# Patient Record
Sex: Male | Born: 1950
Health system: Southern US, Community
[De-identification: ages and names within clinical notes are randomized; demographics above are authoritative.]

## PROBLEM LIST (undated history)

## (undated) DIAGNOSIS — T884XXA Failed or difficult intubation, initial encounter: Secondary | ICD-10-CM

## (undated) DIAGNOSIS — E785 Hyperlipidemia, unspecified: Secondary | ICD-10-CM

## (undated) DIAGNOSIS — E559 Vitamin D deficiency, unspecified: Secondary | ICD-10-CM

## (undated) DIAGNOSIS — N400 Enlarged prostate without lower urinary tract symptoms: Secondary | ICD-10-CM

## (undated) HISTORY — DX: Failed or difficult intubation, initial encounter: T88.4XXA

## (undated) HISTORY — DX: Benign prostatic hyperplasia without lower urinary tract symptoms: N40.0

## (undated) HISTORY — DX: Vitamin D deficiency, unspecified: E55.9

## (undated) HISTORY — PX: CHOLECYSTECTOMY: SHX55

## (undated) HISTORY — DX: Hyperlipidemia, unspecified: E78.5

## (undated) HISTORY — PX: HAND SURGERY: SHX662

---

## 1997-08-02 ENCOUNTER — Ambulatory Visit (HOSPITAL_COMMUNITY): Admission: RE | Admit: 1997-08-02 | Discharge: 1997-08-03 | Payer: Self-pay | Admitting: General Surgery

## 2004-02-05 ENCOUNTER — Ambulatory Visit: Payer: Self-pay

## 2004-02-15 ENCOUNTER — Ambulatory Visit: Payer: Self-pay | Admitting: Cardiology

## 2007-10-22 ENCOUNTER — Ambulatory Visit (HOSPITAL_COMMUNITY): Admission: RE | Admit: 2007-10-22 | Discharge: 2007-10-22 | Payer: Self-pay | Admitting: Orthopaedic Surgery

## 2008-02-29 ENCOUNTER — Encounter (INDEPENDENT_AMBULATORY_CARE_PROVIDER_SITE_OTHER): Payer: Self-pay | Admitting: *Deleted

## 2009-01-24 ENCOUNTER — Telehealth: Payer: Self-pay | Admitting: Internal Medicine

## 2009-08-25 ENCOUNTER — Emergency Department (HOSPITAL_COMMUNITY): Admission: EM | Admit: 2009-08-25 | Discharge: 2009-08-25 | Payer: Self-pay | Admitting: Emergency Medicine

## 2010-02-12 NOTE — Progress Notes (Signed)
Summary: Schedule Colonoscopy  Phone Note Outgoing Call   Call placed by: Hortense Ramal CMA Duncan Dull),  January 24, 2009 1:50 PM Call placed to: Patient Summary of Call: Left message for patient to call back regarding scheduling a recall colonoscopy (due to family history of colon polyps). Initial call taken by: Hortense Ramal CMA [facility] Dull),  January 24, 2009 1:50 PM  Follow-up for Phone Call        Wife states that she will tell patient that he needs to schedule colonoscopy. She states that he works until 5 daily and is unable to speak with [place]. Hortense Ramal CMA Duncan Dull)  January 25, 2009 8:12 AM   Additional Follow-up for Phone Call Additional follow up Details #1::        Patient's wife states that patient got message regarding scheduling a colonoscopy. She states that he does not want to schedule a colonoscopy at this time but that he will call back if he chooses to schedule. Additional Follow-up by: Hortense Ramal CMA Duncan Dull),  January 30, 2009 1:23 PM

## 2011-06-24 ENCOUNTER — Encounter: Payer: Self-pay | Admitting: Internal Medicine

## 2011-08-11 ENCOUNTER — Ambulatory Visit (AMBULATORY_SURGERY_CENTER): Payer: BC Managed Care – PPO | Admitting: *Deleted

## 2011-08-11 VITALS — Ht 74.0 in | Wt 192.0 lb

## 2011-08-11 DIAGNOSIS — Z8371 Family history of colonic polyps: Secondary | ICD-10-CM

## 2011-08-11 DIAGNOSIS — T884XXA Failed or difficult intubation, initial encounter: Secondary | ICD-10-CM | POA: Insufficient documentation

## 2011-08-11 DIAGNOSIS — Z1211 Encounter for screening for malignant neoplasm of colon: Secondary | ICD-10-CM

## 2011-08-11 MED ORDER — MOVIPREP 100 G PO SOLR: ORAL | Status: DC

## 2011-08-12 ENCOUNTER — Encounter: Payer: Self-pay | Admitting: Internal Medicine

## 2011-08-29 ENCOUNTER — Encounter: Payer: Self-pay | Admitting: Internal Medicine

## 2011-08-29 ENCOUNTER — Ambulatory Visit (AMBULATORY_SURGERY_CENTER): Payer: BC Managed Care – PPO | Admitting: Internal Medicine

## 2011-08-29 VITALS — BP 140/75 | HR 79 | Temp 97.2°F | Resp 13 | Ht 74.0 in | Wt 192.0 lb

## 2011-08-29 DIAGNOSIS — Z1211 Encounter for screening for malignant neoplasm of colon: Secondary | ICD-10-CM

## 2011-08-29 MED ORDER — SODIUM CHLORIDE 0.9 % IV SOLN: 500.0000 mL | INTRAVENOUS | Status: DC

## 2011-08-29 NOTE — Patient Instructions (Addendum)
Discharge instructions given with verbal understanding. Normal exam. Resume previous medications. YOU HAD AN ENDOSCOPIC PROCEDURE TODAY AT THE [facility]: Refer to the procedure report that was given to you for any specific questions about what was found during the examination.  If the procedure report does not answer your questions, please call your gastroenterologist to clarify.  If you requested that your care partner not be given the details of your procedure findings, then the procedure report has been included in a sealed envelope for you to review at your convenience later.  YOU SHOULD EXPECT: Some feelings of bloating in the abdomen. Passage of more gas than usual.  Walking can help get rid of the air that was put into your GI tract during the procedure and reduce the bloating. If you had a lower endoscopy (such as a colonoscopy or flexible sigmoidoscopy) you may notice spotting of blood in your stool or on the toilet paper. If you underwent a bowel prep for your procedure, then you may not have a normal bowel movement for a few days.  DIET: Your first meal following the procedure should be a light meal and then it is ok to progress to your normal diet.  A half-sandwich or bowl of soup is an example of a good first meal.  Heavy or fried foods are harder to digest and may make you feel nauseous or bloated.  Likewise meals heavy in dairy and vegetables can cause extra gas to form and this can also increase the bloating.  Drink plenty of fluids but you should avoid alcoholic beverages for 24 hours.  ACTIVITY: Your care partner should take you home directly after the procedure.  You should plan to take it easy, moving slowly for the rest of the day.  You can resume normal activity the day after the procedure however you should NOT DRIVE or use heavy machinery for 24 hours (because of the sedation medicines used during the test).    SYMPTOMS TO REPORT IMMEDIATELY: A gastroenterologist  can be reached at any hour.  During normal business hours, 8:30 AM to 5:00 PM Monday through Friday, call [phone].  After hours and on weekends, please call the GI answering service at [phone] who will take a message and have the physician on call contact you.   Following lower endoscopy (colonoscopy or flexible sigmoidoscopy):  Excessive amounts of blood in the stool  Significant tenderness or worsening of abdominal pains  Swelling of the abdomen that is new, acute  Fever of 100F or higher  FOLLOW UP: If any biopsies were taken you will be contacted by phone or by letter within the next 1-3 weeks.  Call your gastroenterologist if you have not heard about the biopsies in 3 weeks.  Our staff will call the home number listed on your records the next business day following your procedure to check on you and address any questions or concerns that you may have at that time regarding the information given to you following your procedure. This is a courtesy call and so if there is no answer at the home number and we have not heard from you through the emergency physician on call, we will assume that you have returned to your regular daily activities without incident.  SIGNATURES/CONFIDENTIALITY: You and/or your care partner have signed paperwork which will be entered into your electronic medical record.  These signatures attest to the fact that that the information above on your After Visit Summary has been reviewed   and is understood.  Full responsibility of the confidentiality of this discharge information lies with you and/or your care-partner. 

## 2011-08-29 NOTE — Op Note (Signed)
[facility] [address] [place], [place]  [address]  COLONOSCOPY PROCEDURE REPORT  PATIENT:  Manuel, Kidd  MR#:  [id number] BIRTHDATE:  August 19, 1950, 61 yrs. old  GENDER:  male ENDOSCOPIST:  Hedwig Morton. Juanda Chance, MD REF. BY:  Rudi Heap, M.D. PROCEDURE DATE:  08/29/2011 PROCEDURE:  Colonoscopy 95621 ASA CLASS:  Class II INDICATIONS:  family Hx of polyps, colorectal cancer screening, average risk prior colon 3086,5784 MEDICATIONS:   MAC sedation, administered by CRNA, propofol (Diprivan) 130 mg  DESCRIPTION OF PROCEDURE:   After the risks and benefits and of the procedure were explained, informed consent was obtained. Digital rectal exam was performed and revealed no rectal masses. The LB CF-[id number] endoscope was introduced through the anus and advanced to the cecum, which was identified by both the appendix and ileocecal valve.  The quality of the prep was good, using MoviPrep.  The instrument was then slowly withdrawn as the colon was fully examined. <<PROCEDUREIMAGES>>  FINDINGS:  No polyps or cancers were seen (see image1, image2, image3, image4, image5, and image6).   Retroflexed views in the rectum revealed no abnormalities.    The scope was then withdrawn from the patient and the procedure completed.  COMPLICATIONS:  None ENDOSCOPIC IMPRESSION: 1) No polyps or cancers 2) Normal colonoscopy RECOMMENDATIONS: 1) High fiber diet.  REPEAT EXAM:  In 10 year(s) for.  ______________________________ Hedwig Morton. Juanda Chance, MD  CC:  n. eSIGNED:   Hedwig Morton. Keltie Labell at 08/29/2011 09:21 AM  Clare Charon, [phone]

## 2011-09-01 ENCOUNTER — Telehealth: Payer: Self-pay | Admitting: *Deleted

## 2011-09-01 NOTE — Telephone Encounter (Signed)
  Follow up Call-  Call back number 08/29/2011  Post procedure Call Back phone  # [phone]  Permission to leave phone message Yes     Patient questions:  Do you have a fever, pain , or abdominal swelling? no Pain Score  0 *  Have you tolerated food without any problems? yes  Have you been able to return to your normal activities? yes  Do you have any questions about your discharge instructions: Diet   no Medications  no Follow up visit  no  Do you have questions or concerns about your Care? no  Actions: * If pain score is 4 or above: No action needed, pain <4.

## 2012-03-02 ENCOUNTER — Encounter: Payer: Self-pay | Admitting: Family Medicine

## 2012-03-02 DIAGNOSIS — E559 Vitamin D deficiency, unspecified: Secondary | ICD-10-CM | POA: Insufficient documentation

## 2012-03-02 DIAGNOSIS — E785 Hyperlipidemia, unspecified: Secondary | ICD-10-CM | POA: Insufficient documentation

## 2012-04-07 ENCOUNTER — Encounter: Payer: Self-pay | Admitting: Family Medicine

## 2012-04-07 ENCOUNTER — Ambulatory Visit (INDEPENDENT_AMBULATORY_CARE_PROVIDER_SITE_OTHER): Payer: BC Managed Care – PPO | Admitting: Family Medicine

## 2012-04-07 VITALS — BP 119/78 | HR 70 | Temp 97.1°F | Ht 74.0 in | Wt 194.0 lb

## 2012-04-07 DIAGNOSIS — E559 Vitamin D deficiency, unspecified: Secondary | ICD-10-CM

## 2012-04-07 DIAGNOSIS — R718 Other abnormality of red blood cells: Secondary | ICD-10-CM

## 2012-04-07 DIAGNOSIS — Z139 Encounter for screening, unspecified: Secondary | ICD-10-CM

## 2012-04-07 DIAGNOSIS — E785 Hyperlipidemia, unspecified: Secondary | ICD-10-CM

## 2012-04-07 DIAGNOSIS — D582 Other hemoglobinopathies: Secondary | ICD-10-CM

## 2012-04-07 LAB — BASIC METABOLIC PANEL WITH GFR
BUN: 12 mg/dL (ref 6–23)
CO2: 28 mEq/L (ref 19–32)
Calcium: 9.4 mg/dL (ref 8.4–10.5)
Chloride: 105 mEq/L (ref 96–112)
Creat: 0.88 mg/dL (ref 0.50–1.35)
GFR, Est Non African American: >89 mL/min
Potassium: 4.2 mEq/L (ref 3.5–5.3)
Sodium: 140 mEq/L (ref 135–145)

## 2012-04-07 LAB — POCT CBC
Granulocyte percent: 64.2 %G (ref 37–80)
HCT, POC: 46.8 % (ref 43.5–53.7)
Hemoglobin: 15.4 g/dL (ref 14.1–18.1)
Lymph, poc: 1.3 (ref 0.6–3.4)
MCH, POC: 27.6 pg (ref 27–31.2)
MCHC: 33 g/dL (ref 31.8–35.4)
MCV: 83.4 fL (ref 80–97)
POC Granulocyte: 3 (ref 2–6.9)
POC LYMPH PERCENT: 28.5 %L (ref 10–50)
Platelet Count, POC: 141 10*3/uL — AB (ref 142–424)
RBC: 5.6 M/uL (ref 4.69–6.13)
RDW, POC: 12.8 %

## 2012-04-07 LAB — HEPATIC FUNCTION PANEL
Albumin: 4.2 g/dL (ref 3.5–5.2)
Alkaline Phosphatase: 50 U/L (ref 39–117)
Bilirubin, Direct: 0.2 mg/dL (ref 0.0–0.3)
Total Bilirubin: 0.9 mg/dL (ref 0.3–1.2)
Total Protein: 6.8 g/dL (ref 6.0–8.3)

## 2012-04-07 NOTE — Patient Instructions (Addendum)
FOBT pt declined Continue current meds and therapeutic lifestyle changes

## 2012-04-07 NOTE — Progress Notes (Signed)
  Subjective:    Patient ID: Manuel Kidd, male    DOB: April 03, 1950, 62 y.o.   MRN: [id number]  HPI This patient presents for recheck of multiple medical problems.   Patient Active Problem List  Diagnosis  . Difficult airway for intubation  . Vitamin D deficiency  . Other and unspecified hyperlipidemia      The allergies, current medications, past medical history, surgical history, family and social history are reviewed.  Immunizations reviewed.  Health maintenance reviewed.        Review of Systems  Constitutional: Negative.   HENT: Negative.   Eyes: Negative.   Respiratory: Negative.   Cardiovascular: Negative.   Gastrointestinal: Negative.   Genitourinary: Negative.   Musculoskeletal: Negative.   Neurological: Negative.   Psychiatric/Behavioral: Negative.        Objective:   Physical Exam BP 119/78  Pulse 70  Temp(Src) 97.1 F (36.2 C) (Oral)  Ht 6\' 2"  (1.88 m)  Wt 194 lb (87.998 kg)  BMI 24.9 kg/m2  The patient appeared well nourished and normally developed, alert and oriented to time and place. Speech, behavior and judgement appear normal. Vital signs as documented.  Head exam is unremarkable. No scleral icterus or pallor noted.  Neck is without jugular venous distension, thyromegally, or carotid bruits. Carotid upstrokes are brisk bilaterally. No cervical adenopathy. Lungs are clear anteriorly and posteriorly to auscultation. Normal respiratory effort. Cardiac exam reveals regular rate and rhythm.Rate is 72/min. First and second heart sounds normal. No murmurs, rubs or gallops.  Abdominal exam reveals normal bowl sounds, no masses, no organomegaly and no aortic enlargement. No inguinal adenopathy. Extremities are nonedematous and both femoral and pedal pulses are normal. Skin without pallor or jaundice.Bruise to right chest wall.  Warm and dry, without rash. Neurologic exam reveals normal deep tendon reflexes and normal sensation.           Assessment & Plan:   1. Other and unspecified hyperlipidemia  - BASIC METABOLIC PANEL WITH GFR - Hepatic function panel - NMR Lipoprofile with Lipids  2. Screening  - POCT CBC - Vitamin D 25 hydroxy  3. Vitamin D deficiency  - Vitamin D 25 hydroxy  4. Elevated hematocrit  - POCT CBC

## 2012-04-08 LAB — VITAMIN D 25 HYDROXY (VIT D DEFICIENCY, FRACTURES): Vit D, 25-Hydroxy: 43 ng/mL (ref 30–89)

## 2012-04-09 LAB — NMR LIPOPROFILE WITH LIPIDS
HDL Particle Number: 28 umol/L — ABNORMAL LOW (ref >=30.5)
LDL Particle Number: 1031 nmol/L — ABNORMAL HIGH (ref <1000)
Large HDL-P: 3.2 umol/L — ABNORMAL LOW (ref >=4.8)

## 2012-08-16 ENCOUNTER — Ambulatory Visit (INDEPENDENT_AMBULATORY_CARE_PROVIDER_SITE_OTHER): Payer: BC Managed Care – PPO | Admitting: Family Medicine

## 2012-08-16 ENCOUNTER — Other Ambulatory Visit: Payer: BC Managed Care – PPO

## 2012-08-16 ENCOUNTER — Encounter: Payer: Self-pay | Admitting: Family Medicine

## 2012-08-16 VITALS — BP 119/73 | HR 61 | Temp 97.2°F | Ht 74.0 in | Wt 191.6 lb

## 2012-08-16 DIAGNOSIS — R5383 Other fatigue: Secondary | ICD-10-CM

## 2012-08-16 DIAGNOSIS — E785 Hyperlipidemia, unspecified: Secondary | ICD-10-CM

## 2012-08-16 DIAGNOSIS — E559 Vitamin D deficiency, unspecified: Secondary | ICD-10-CM

## 2012-08-16 DIAGNOSIS — N400 Enlarged prostate without lower urinary tract symptoms: Secondary | ICD-10-CM | POA: Insufficient documentation

## 2012-08-16 DIAGNOSIS — R5381 Other malaise: Secondary | ICD-10-CM

## 2012-08-16 LAB — POCT CBC
Granulocyte percent: 70.2 %G (ref 37–80)
HCT, POC: 43.2 % — AB (ref 43.5–53.7)
Hemoglobin: 14.9 g/dL (ref 14.1–18.1)
Lymph, poc: 1.2 (ref 0.6–3.4)
MCH, POC: 28.4 pg (ref 27–31.2)
MCHC: 34.5 g/dL (ref 31.8–35.4)
MCV: 82.3 fL (ref 80–97)
MPV: 7.4 fL (ref 0–99.8)
POC Granulocyte: 3.3 (ref 2–6.9)
POC LYMPH PERCENT: 25.6 %L (ref 10–50)
Platelet Count, POC: 124 10*3/uL — AB (ref 142–424)
RBC: 5.3 M/uL (ref 4.69–6.13)
RDW, POC: 12.8 %
WBC: 4.7 10*3/uL (ref 4.6–10.2)

## 2012-08-16 NOTE — Patient Instructions (Addendum)
Fall precautions discussed Continue current meds and therapeutic lifestyle changes Return FOBT as directed

## 2012-08-16 NOTE — Progress Notes (Signed)
  Subjective:    Patient ID: Manuel Kidd, male    DOB: [birth year], 62 y.o.   MRN: [id number]  HPI Patient comes in today for followup of chronic medical problems. These include hyperlipidemia BPH and vitamin D deficiency. He comes in today with his wife. As far as health maintenance parameters he is to use a PSA, rectal exam and an FOBT.  Review of Systems  Constitutional: Positive for fatigue (slight).  HENT: Negative for ear pain, congestion, sore throat, postnasal drip and sinus pressure.   Respiratory: Negative.  Negative for cough, choking, shortness of breath and wheezing.   Cardiovascular: Negative for chest pain, palpitations and leg swelling.  Gastrointestinal: Negative.  Negative for nausea, vomiting, abdominal pain, diarrhea and constipation.  Endocrine: Negative.  Negative for cold intolerance and polydipsia.  Genitourinary: Negative for dysuria, urgency, frequency, hematuria, scrotal swelling and penile pain.  Musculoskeletal: Negative.  Negative for back pain, joint swelling and arthralgias.  Skin: Negative.  Negative for color change, rash and wound.  Allergic/Immunologic: Negative for environmental allergies and food allergies.  Neurological: Negative for dizziness, tremors, syncope, speech difficulty, weakness, light-headedness and headaches.  Hematological: Negative.   Psychiatric/Behavioral: Negative.  Negative for sleep disturbance. The patient is not nervous/anxious.        Objective:   Physical Exam BP 119/73  Pulse 61  Temp(Src) 97.2 F (36.2 C) (Oral)  Ht 6\' 2"  (1.88 m)  Wt 191 lb 9.6 oz (86.909 kg)  BMI 24.59 kg/m2  The patient appeared well nourished and normally developed, alert and oriented to time and place. Speech, behavior and judgement appear normal. Vital signs as documented.  Head exam is unremarkable. No scleral icterus or pallor noted. Ears nose and throat were within normal limits.  Neck is without jugular venous distension, thyromegally, or  carotid bruits. Carotid upstrokes are brisk bilaterally. No cervical adenopathy. Lungs are clear anteriorly and posteriorly to auscultation. Normal respiratory effort. Cardiac exam reveals regular rate and rhythm at 60 per minute. First and second heart sounds normal.  No murmurs, rubs or gallops.  Abdominal exam reveals normal bowl sounds, no masses, no organomegaly and no aortic enlargement. No inguinal adenopathy.  And prostate exam was done today. There were no rectal masses. Prostate was smooth enlarged without lumps. There was no hernia palpated. Extremities are nonedematous and both femoral and pedal pulses are normal. Skin without pallor or jaundice.  Warm and dry, without rash. He did have some bruises on his abdomen and his left thigh, and he attributed these to his work. Neurologic exam reveals normal deep tendon reflexes and normal sensation.          Assessment & Plan:  1. Hyperlipidemia - Hepatic function panel; Standing - NMR, lipoprofile; Standing - Hepatic function panel - NMR, lipoprofile  2. Vitamin D deficiency - Vitamin D 25 hydroxy; Standing - Vitamin D 25 hydroxy  3. BPH (benign prostatic hypertrophy) - PSA, total and free  4. Fatigue - POCT CBC; Standing - BMP8+EGFR; Standing - Thyroid Panel With TSH - POCT CBC - BMP8+EGFR  Patient Instructions  Fall precautions discussed Continue current meds and therapeutic lifestyle changes Return FOBT as directed   Nyra Capes MD

## 2012-08-18 LAB — HEPATIC FUNCTION PANEL
Albumin: 4.5 g/dL (ref 3.6–4.8)
Total Bilirubin: 0.6 mg/dL (ref 0.0–1.2)
Total Protein: 6.5 g/dL (ref 6.0–8.5)

## 2012-08-18 LAB — NMR, LIPOPROFILE
Cholesterol: 132 mg/dL (ref <200)
HDL Cholesterol by NMR: 40 mg/dL (ref >=40)
LDL Particle Number: 1059 nmol/L — ABNORMAL HIGH (ref <1000)
LDL Size: 20.7 nm (ref >20.5)
LDLC SERPL CALC-MCNC: 79 mg/dL (ref <100)
LP-IR Score: 43 (ref <=45)
Triglycerides by NMR: 63 mg/dL (ref <150)

## 2012-08-18 LAB — THYROID PANEL WITH TSH
Free Thyroxine Index: 2.3 (ref 1.2–4.9)
T3 Uptake Ratio: 27 % (ref 24–39)
T4, Total: 8.7 ug/dL (ref 4.5–12.0)

## 2012-08-18 LAB — BMP8+EGFR
BUN/Creatinine Ratio: 17 (ref 10–22)
BUN: 16 mg/dL (ref 8–27)
CO2: 25 mmol/L (ref 18–29)
GFR calc Af Amer: 99 mL/min/{1.73_m2} (ref >59)

## 2012-08-18 LAB — PSA, TOTAL AND FREE: PSA, Free Pct: 24.4 %

## 2012-08-30 ENCOUNTER — Telehealth: Payer: Self-pay | Admitting: *Deleted

## 2012-08-30 NOTE — Telephone Encounter (Signed)
Message copied by Bearl Mulberry on Mon Aug 30, 2012  9:38 AM ------      Message from: Ernestina Penna      Created: Wed Aug 18, 2012  7:56 AM       The PSA is within normal limits at 2.7-----the previous PSA was 2.67 and therefore this number is stable.      The blood sugar kidney function tests and electrolytes were good.      All liver function tests were within normal limits.      Thyroid tests were within normal limits.      Vitamin D was good and within normal limits.      : Advanced lipid testing the total LDL particle number was about the same as it was before at 1059. Previously it was 1031. This number should be less than 1000. The HDL particle number remains low. The triglycerides are good. Continue current meds but try to do better with diet and exercise ------

## 2012-08-30 NOTE — Telephone Encounter (Signed)
Pt's wife notified of results. 

## 2012-11-18 ENCOUNTER — Other Ambulatory Visit: Payer: Self-pay

## 2012-12-20 ENCOUNTER — Encounter: Payer: Self-pay | Admitting: *Deleted

## 2012-12-20 ENCOUNTER — Ambulatory Visit (INDEPENDENT_AMBULATORY_CARE_PROVIDER_SITE_OTHER): Payer: BC Managed Care – PPO | Admitting: Family Medicine

## 2012-12-20 ENCOUNTER — Encounter: Payer: Self-pay | Admitting: Family Medicine

## 2012-12-20 VITALS — BP 123/76 | HR 67 | Temp 97.6°F | Ht 74.0 in | Wt 195.0 lb

## 2012-12-20 DIAGNOSIS — E559 Vitamin D deficiency, unspecified: Secondary | ICD-10-CM

## 2012-12-20 DIAGNOSIS — N400 Enlarged prostate without lower urinary tract symptoms: Secondary | ICD-10-CM

## 2012-12-20 DIAGNOSIS — L570 Actinic keratosis: Secondary | ICD-10-CM

## 2012-12-20 DIAGNOSIS — E785 Hyperlipidemia, unspecified: Secondary | ICD-10-CM

## 2012-12-20 LAB — POCT CBC
Granulocyte percent: 66 %G (ref 37–80)
HCT, POC: 49 % (ref 43.5–53.7)
Hemoglobin: 15.7 g/dL (ref 14.1–18.1)
MCHC: 32.1 g/dL (ref 31.8–35.4)
MPV: 7 fL (ref 0–99.8)
RBC: 5.7 M/uL (ref 4.69–6.13)
RDW, POC: 12.9 %

## 2012-12-20 NOTE — Progress Notes (Signed)
Subjective:    Patient ID: Manuel Kidd, male    DOB: April 10, 1950, 62 y.o.   MRN: [id number]  HPI Pt here for follow up and management of chronic medical problems. Patient has no particular complaints as noted by the review of systems. He does have an irritated area on the left ear pinna. He says this has been here for 2-3 years. He'll get lab work done today. He is given and FOBT today to return. And he is going to check with his insurance regarding the Prevnar vaccine.      Patient Active Problem List   Diagnosis Date Noted  . Hyperlipidemia 08/16/2012  . BPH (benign prostatic hypertrophy) 08/16/2012  . Vitamin D deficiency   . Difficult airway for intubation    Outpatient Encounter Prescriptions as of 12/20/2012  Medication Sig  . aspirin 81 MG tablet Take 81 mg by mouth daily.  . Multiple Vitamin (MULTIVITAMIN) tablet Take 1 tablet by mouth daily.  . Omega-3 Fatty Acids (FISH OIL) 1000 MG CAPS Take 1 capsule by mouth daily. 5 days a week  . simvastatin (ZOCOR) 10 MG tablet Take 1 tablet by mouth Daily.  Marland Kitchen VITAMIN D, ERGOCALCIFEROL, PO Take 2,000 Units by mouth daily. 5 days a week    Review of Systems  Constitutional: Negative.   HENT: Negative.   Eyes: Negative.   Respiratory: Negative.   Cardiovascular: Negative.   Gastrointestinal: Negative.   Endocrine: Negative.   Genitourinary: Negative.   Musculoskeletal: Negative.   Skin: Negative.        Check place on left ear  Allergic/Immunologic: Negative.   Neurological: Negative.   Hematological: Negative.   Psychiatric/Behavioral: Negative.        Objective:   Physical Exam  Nursing note and vitals reviewed. Constitutional: He is oriented to person, place, and time. He appears well-developed and well-nourished. No distress.  Pleasant and cooperative  HENT:  Head: Normocephalic and atraumatic.  Right Ear: External ear normal.  Left Ear: External ear normal.  Nose: Nose normal.  Mouth/Throat: Oropharynx is  clear and moist. No oropharyngeal exudate.  Eyes: Conjunctivae and EOM are normal. Pupils are equal, round, and reactive to light. Right eye exhibits no discharge. Left eye exhibits no discharge. No scleral icterus.  Neck: Normal range of motion. Neck supple. No thyromegaly present.  No carotid bruits  Cardiovascular: Normal rate, regular rhythm and normal heart sounds.  Exam reveals no gallop and no friction rub.   No murmur heard. 60 per minute  Pulmonary/Chest: Effort normal and breath sounds normal. No respiratory distress. He has no wheezes. He has no rales. He exhibits no tenderness.  Abdominal: Soft. Bowel sounds are normal. He exhibits no mass. There is no tenderness. There is no rebound and no guarding.  Musculoskeletal: Normal range of motion. He exhibits no edema and no tenderness.  Lymphadenopathy:    He has no cervical adenopathy.  Neurological: He is alert and oriented to person, place, and time. He has normal reflexes. No cranial nerve deficit.  Skin: Skin is warm and dry. No rash noted. No erythema. No pallor.  Small dry skin patch on left ear pinna  Psychiatric: He has a normal mood and affect. His behavior is normal. Judgment and thought content normal.   BP 123/76  Pulse 67  Temp(Src) 97.6 F (36.4 C) (Oral)  Ht 6\' 2"  (1.88 m)  Wt 195 lb (88.451 kg)  BMI 25.03 kg/m2  Cryotherapy was performed on the left ear for an actinic keratosis.  The patient tolerated this procedure well.      Assessment & Plan:  1. Vitamin D deficiency - POCT CBC - Vit D  25 hydroxy (rtn osteoporosis monitoring)  2. Hyperlipidemia - POCT CBC - Hepatic function panel - BMP8+EGFR - NMR, lipoprofile  3. BPH (benign prostatic hypertrophy) - POCT CBC  4. Actinic keratosis   Patient Instructions  Continue current medications. Continue good therapeutic lifestyle changes which include good diet and exercise. Fall precautions discussed with patient. Schedule your flu vaccine if you  haven't had it yet If you are over 23 years old - you may need Prevnar 13 or the adult Pneumonia vaccine. The area on the left ear where we did cryotherapy today may form a small blister. Keep the area clean with alcohol and if any future problems return to clinic Use a cool mist humidifier this winter at home and keep the house cooler   Nyra Capes MD

## 2012-12-20 NOTE — Patient Instructions (Addendum)
Continue current medications. Continue good therapeutic lifestyle changes which include good diet and exercise. Fall precautions discussed with patient. Schedule your flu vaccine if you haven't had it yet If you are over 62 years old - you may need Prevnar 13 or the adult Pneumonia vaccine. The area on the left ear where we did cryotherapy today may form a small blister. Keep the area clean with alcohol and if any future problems return to clinic Use a cool mist humidifier this winter at home and keep the house cooler

## 2012-12-20 NOTE — Addendum Note (Signed)
Addended by: Orma Render F on: 12/20/2012 03:41 PM   Modules accepted: Orders

## 2012-12-20 NOTE — Addendum Note (Signed)
Addended by: Orma Render F on: 12/20/2012 03:46 PM   Modules accepted: Orders

## 2012-12-20 NOTE — Progress Notes (Signed)
Quick Note:  Copy of labs sent to patient ______ 

## 2012-12-21 ENCOUNTER — Ambulatory Visit: Payer: BC Managed Care – PPO | Admitting: Family Medicine

## 2012-12-22 LAB — HEPATIC FUNCTION PANEL
ALT: 17 IU/L (ref 0–44)
AST: 24 IU/L (ref 0–40)
Bilirubin, Direct: 0.19 mg/dL (ref 0.00–0.40)
Total Bilirubin: 0.7 mg/dL (ref 0.0–1.2)

## 2012-12-22 LAB — BMP8+EGFR
BUN: 12 mg/dL (ref 8–27)
Creatinine, Ser: 0.94 mg/dL (ref 0.76–1.27)
Glucose: 98 mg/dL (ref 65–99)
Sodium: 143 mmol/L (ref 134–144)

## 2012-12-22 LAB — NMR, LIPOPROFILE
HDL Particle Number: 27 umol/L — ABNORMAL LOW (ref >=30.5)
LDL Size: 20.4 nm — ABNORMAL LOW (ref >20.5)
Small LDL Particle Number: 854 nmol/L — ABNORMAL HIGH (ref <=527)

## 2012-12-22 LAB — FECAL OCCULT BLOOD, IMMUNOCHEMICAL: Fecal Occult Bld: NEGATIVE

## 2013-04-20 ENCOUNTER — Ambulatory Visit (INDEPENDENT_AMBULATORY_CARE_PROVIDER_SITE_OTHER): Payer: BC Managed Care – PPO

## 2013-04-20 ENCOUNTER — Ambulatory Visit (INDEPENDENT_AMBULATORY_CARE_PROVIDER_SITE_OTHER): Payer: BC Managed Care – PPO | Admitting: Family Medicine

## 2013-04-20 ENCOUNTER — Encounter: Payer: Self-pay | Admitting: Family Medicine

## 2013-04-20 VITALS — BP 124/75 | HR 61 | Temp 97.4°F | Ht 74.0 in | Wt 195.0 lb

## 2013-04-20 DIAGNOSIS — E559 Vitamin D deficiency, unspecified: Secondary | ICD-10-CM

## 2013-04-20 DIAGNOSIS — E785 Hyperlipidemia, unspecified: Secondary | ICD-10-CM

## 2013-04-20 DIAGNOSIS — N400 Enlarged prostate without lower urinary tract symptoms: Secondary | ICD-10-CM

## 2013-04-20 LAB — POCT CBC
GRANULOCYTE PERCENT: 75 % (ref 37–80)
HCT, POC: 47.3 % (ref 43.5–53.7)
Hemoglobin: 15.5 g/dL (ref 14.1–18.1)
Lymph, poc: 1.1 (ref 0.6–3.4)
MCH: 27.7 pg (ref 27–31.2)
MCHC: 32.7 g/dL (ref 31.8–35.4)
MCV: 84.6 fL (ref 80–97)
MPV: 7.7 fL (ref 0–99.8)
POC Granulocyte: 3.9 (ref 2–6.9)
POC LYMPH %: 21.2 % (ref 10–50)
Platelet Count, POC: 127 10*3/uL — AB (ref 142–424)
RBC: 5.6 M/uL (ref 4.69–6.13)
RDW, POC: 12.8 %
WBC: 5.2 10*3/uL (ref 4.6–10.2)

## 2013-04-20 NOTE — Patient Instructions (Addendum)
Continue current medications. Continue good therapeutic lifestyle changes which include good diet and exercise. Fall precautions discussed with patient. If an FOBT was given today- please return it to our front desk. If you are over 8910 years old - you may need Prevnar 13 or the adult Pneumonia vaccine.  Will call him with the results of the lab work once these results are available as well as an official reading on the chest x-ray.

## 2013-04-20 NOTE — Progress Notes (Signed)
Subjective:    Patient ID: Manuel Kidd, male    DOB: 21-Feb-1950, 63 y.o.   MRN: [id number]  HPI Pt here for follow up and management of chronic medical problems. The patient is doing well today with no particular problems. He is due to receive a chest x-ray and get his lab checked today. He was informed that he needed to check with his insurance regarding the Prevnar vaccine.         Patient Active Problem List   Diagnosis Date Noted  . Hyperlipidemia 08/16/2012  . BPH (benign prostatic hypertrophy) 08/16/2012  . Vitamin D deficiency   . Difficult airway for intubation    Outpatient Encounter Prescriptions as of 04/20/2013  Medication Sig  . aspirin 81 MG tablet Take 81 mg by mouth daily.  . Multiple Vitamin (MULTIVITAMIN) tablet Take 1 tablet by mouth daily.  . Omega-3 Fatty Acids (FISH OIL) 1000 MG CAPS Take 1 capsule by mouth daily. 5 days a week  . simvastatin (ZOCOR) 10 MG tablet Take 1 tablet by mouth Daily.  Marland Kitchen VITAMIN D, ERGOCALCIFEROL, PO Take 2,000 Units by mouth daily. 5 days a week    Review of Systems  Constitutional: Negative.   HENT: Negative.   Eyes: Negative.   Respiratory: Negative.   Cardiovascular: Negative.   Gastrointestinal: Negative.   Endocrine: Negative.   Genitourinary: Negative.   Musculoskeletal: Negative.   Skin: Negative.   Allergic/Immunologic: Negative.   Neurological: Negative.   Hematological: Negative.   Psychiatric/Behavioral: Negative.        Objective:   Physical Exam  Nursing note and vitals reviewed. Constitutional: He is oriented to person, place, and time. He appears well-developed and well-nourished.  HENT:  Head: Normocephalic and atraumatic.  Right Ear: External ear normal.  Left Ear: External ear normal.  Nose: Nose normal.  Mouth/Throat: Oropharynx is clear and moist. No oropharyngeal exudate.  Eyes: Conjunctivae and EOM are normal. Pupils are equal, round, and reactive to light. Right eye exhibits no discharge.  Left eye exhibits no discharge. No scleral icterus.  Neck: Normal range of motion. Neck supple. No thyromegaly present.  No carotid bruits  Cardiovascular: Normal rate, regular rhythm, normal heart sounds and intact distal pulses.  Exam reveals no gallop and no friction rub.   No murmur heard. At 72 per minute  Pulmonary/Chest: Effort normal and breath sounds normal. No respiratory distress. He has no wheezes. He has no rales. He exhibits no tenderness.  Abdominal: Soft. Bowel sounds are normal. He exhibits no mass. There is no tenderness. There is no rebound and no guarding.  Musculoskeletal: Normal range of motion. He exhibits no edema and no tenderness.  Lymphadenopathy:    He has no cervical adenopathy.  Neurological: He is alert and oriented to person, place, and time. He has normal reflexes. No cranial nerve deficit.  Skin: Skin is warm and dry. No rash noted. No erythema. No pallor.  Psychiatric: He has a normal mood and affect. His behavior is normal. Judgment and thought content normal.   BP 124/75  Pulse 61  Temp(Src) 97.4 F (36.3 C) (Oral)  Ht $R'6\' 2"'JD$  (1.88 m)  Wt 195 lb (88.451 kg)  BMI 25.03 kg/m2  [facility] reading (PRIMARY) by  Dr. Brunilda Payor x-ray-no active disease                                       Assessment &  Plan:  1. BPH (benign prostatic hypertrophy) - POCT CBC  2. Hyperlipidemia - POCT CBC - BMP8+EGFR - Hepatic function panel - NMR, lipoprofile - DG Chest 2 View; Future  3. Vitamin D deficiency - POCT CBC - Vit D  25 hydroxy (rtn osteoporosis monitoring)  Patient Instructions  Continue current medications. Continue good therapeutic lifestyle changes which include good diet and exercise. Fall precautions discussed with patient. If an FOBT was given today- please return it to our front desk. If you are over 21 years old - you may need Prevnar 59 or the adult Pneumonia vaccine.  Will call him with the results of the lab work once these results are  available as well as an official reading on the chest x-ray.   Arrie Senate MD

## 2013-04-21 LAB — HEPATIC FUNCTION PANEL
ALBUMIN: 4.5 g/dL (ref 3.6–4.8)
ALT: 20 IU/L (ref 0–44)
AST: 28 IU/L (ref 0–40)
Alkaline Phosphatase: 58 IU/L (ref 39–117)
BILIRUBIN DIRECT: 0.17 mg/dL (ref 0.00–0.40)
BILIRUBIN TOTAL: 0.7 mg/dL (ref 0.0–1.2)
TOTAL PROTEIN: 6.9 g/dL (ref 6.0–8.5)

## 2013-04-21 LAB — BMP8+EGFR
BUN/Creatinine Ratio: 14 (ref 10–22)
BUN: 14 mg/dL (ref 8–27)
CALCIUM: 9.3 mg/dL (ref 8.6–10.2)
CO2: 27 mmol/L (ref 18–29)
CREATININE: 1 mg/dL (ref 0.76–1.27)
Chloride: 101 mmol/L (ref 97–108)
GFR calc Af Amer: 93 mL/min/{1.73_m2} (ref >59)
GFR, EST NON AFRICAN AMERICAN: 80 mL/min/{1.73_m2} (ref >59)
Glucose: 96 mg/dL (ref 65–99)
Potassium: 4.4 mmol/L (ref 3.5–5.2)
SODIUM: 144 mmol/L (ref 134–144)

## 2013-04-21 LAB — NMR, LIPOPROFILE
Cholesterol: 183 mg/dL (ref <200)
HDL CHOLESTEROL BY NMR: 45 mg/dL (ref >=40)
HDL Particle Number: 30.9 umol/L (ref >=30.5)
LDL Particle Number: 1923 nmol/L — ABNORMAL HIGH (ref <1000)
LDL Size: 20.3 nm — ABNORMAL LOW (ref >20.5)
LDLC SERPL CALC-MCNC: 124 mg/dL — ABNORMAL HIGH (ref <100)
LP-IR SCORE: 51 — AB (ref <=45)
Small LDL Particle Number: 1079 nmol/L — ABNORMAL HIGH (ref <=527)
Triglycerides by NMR: 70 mg/dL (ref <150)

## 2013-04-21 LAB — VITAMIN D 25 HYDROXY (VIT D DEFICIENCY, FRACTURES): VIT D 25 HYDROXY: 62.2 ng/mL (ref 30.0–100.0)

## 2013-08-25 ENCOUNTER — Ambulatory Visit (INDEPENDENT_AMBULATORY_CARE_PROVIDER_SITE_OTHER): Payer: BC Managed Care – PPO | Admitting: Family Medicine

## 2013-08-25 ENCOUNTER — Encounter: Payer: Self-pay | Admitting: Family Medicine

## 2013-08-25 VITALS — BP 125/81 | HR 66 | Temp 97.6°F | Ht 74.0 in | Wt 195.0 lb

## 2013-08-25 DIAGNOSIS — E785 Hyperlipidemia, unspecified: Secondary | ICD-10-CM

## 2013-08-25 DIAGNOSIS — Z0000 Encounter for general adult medical examination without abnormal findings: Secondary | ICD-10-CM

## 2013-08-25 DIAGNOSIS — N400 Enlarged prostate without lower urinary tract symptoms: Secondary | ICD-10-CM

## 2013-08-25 DIAGNOSIS — E559 Vitamin D deficiency, unspecified: Secondary | ICD-10-CM

## 2013-08-25 LAB — POCT UA - MICROSCOPIC ONLY
Casts, Ur, LPF, POC: NEGATIVE
Crystals, Ur, HPF, POC: NEGATIVE
Mucus, UA: NEGATIVE
RBC, URINE, MICROSCOPIC: NEGATIVE
WBC, UR, HPF, POC: NEGATIVE
Yeast, UA: NEGATIVE

## 2013-08-25 LAB — POCT URINALYSIS DIPSTICK
BILIRUBIN UA: NEGATIVE
Glucose, UA: NEGATIVE
Ketones, UA: NEGATIVE
Leukocytes, UA: NEGATIVE
Nitrite, UA: NEGATIVE
PH UA: 8
Urobilinogen, UA: NEGATIVE

## 2013-08-25 NOTE — Patient Instructions (Addendum)
Continue current medications. Continue good therapeutic lifestyle changes which include good diet and exercise. Fall precautions discussed with patient. If an FOBT was given today- please return it to our front desk. If you are over 454 years old - you may need Prevnar 13 or the adult Pneumonia vaccine.  Be sure a check with your insurance regarding the Prevnar vaccine. We will call you with the results of your lab work once those results are available Continue to exercise regularly Drink plenty of water and fluids

## 2013-08-25 NOTE — Progress Notes (Signed)
 Subjective:    Patient ID: Manuel Kidd, male    DOB: 05/23/1950, 63 y.o.   MRN: [id number]  HPI Patient is here today for annual wellness exam and follow up of chronic medical problems. The patient is doing well with no particular complaints. He will have lab work done today. He will check with his insurance for the Prevnar vaccine coverage.           Patient Active Problem List   Diagnosis Date Noted  . Hyperlipidemia 08/16/2012  . BPH (benign prostatic hypertrophy) 08/16/2012  . Vitamin D deficiency   . Difficult airway for intubation    Outpatient Encounter Prescriptions as of 08/25/2013  Medication Sig  . aspirin 81 MG tablet Take 81 mg by mouth daily.  . Multiple Vitamin (MULTIVITAMIN) tablet Take 1 tablet by mouth daily.  . Omega-3 Fatty Acids (FISH OIL) 1000 MG CAPS Take 1 capsule by mouth daily. 5 days a week  . simvastatin (ZOCOR) 10 MG tablet Take 1 tablet by mouth Daily.  . VITAMIN D, ERGOCALCIFEROL, PO Take 2,000 Units by mouth daily. 5 days a week    Review of Systems  Constitutional: Negative.   HENT: Negative.   Eyes: Negative.   Respiratory: Negative.   Cardiovascular: Negative.   Gastrointestinal: Negative.   Endocrine: Negative.   Genitourinary: Negative.   Musculoskeletal: Negative.   Skin: Negative.   Allergic/Immunologic: Negative.   Neurological: Negative.   Hematological: Negative.   Psychiatric/Behavioral: Negative.        Objective:   Physical Exam  Nursing note and vitals reviewed. Constitutional: He is oriented to person, place, and time. He appears well-developed and well-nourished. No distress.  HENT:  Head: Normocephalic and atraumatic.  Right Ear: External ear normal.  Left Ear: External ear normal.  Mouth/Throat: Oropharynx is clear and moist. No oropharyngeal exudate.  Nasal pallor and turbinate swelling bilaterally  Eyes: Conjunctivae and EOM are normal. Pupils are equal, round, and reactive to light. Right eye exhibits no  discharge. Left eye exhibits no discharge. No scleral icterus.  Neck: Normal range of motion. Neck supple. No tracheal deviation present. No thyromegaly present.  No carotid bruits auscultated  Cardiovascular: Normal rate, regular rhythm, normal heart sounds and intact distal pulses.  Exam reveals no gallop and no friction rub.   No murmur heard. At 72 per minute  Pulmonary/Chest: Effort normal and breath sounds normal. No respiratory distress. He has no wheezes. He has no rales. He exhibits no tenderness.  Abdominal: Soft. Bowel sounds are normal. He exhibits no mass. There is no tenderness. There is no rebound and no guarding.  No abdominal bruits or inguinal nodes  Genitourinary: Rectum normal and penis normal.  The prostate was enlarged but soft and smooth. The right was slightly bigger than the left. There were no prostate masses. There were no rectal masses. There were no inguinal hernias. There were no inguinal nodes. The external genitalia were normal  Musculoskeletal: Normal range of motion. He exhibits no edema and no tenderness.  Lymphadenopathy:    He has no cervical adenopathy.  Neurological: He is alert and oriented to person, place, and time. He has normal reflexes. No cranial nerve deficit.  Skin: Skin is warm and dry. No rash noted. No erythema. No pallor.  Resolving insect bites on lower extremitys  Psychiatric: He has a normal mood and affect. His behavior is normal. Judgment and thought content normal.   BP 125/81  Pulse 66  Temp(Src) 97.6 F (36.4 C) (Oral)    Ht 6' 2" (1.88 m)  Wt 195 lb (88.451 kg)  BMI 25.03 kg/m2  EKG: Sinus bradycardia otherwise normal EKG       Assessment & Plan:  1. BPH (benign prostatic hypertrophy) - POCT CBC - PSA, total and free  2. Hyperlipidemia - POCT CBC - BMP8+EGFR - Hepatic function panel - NMR, lipoprofile  3. Vitamin D deficiency - POCT CBC - Vit D  25 hydroxy (rtn osteoporosis monitoring)  4. Annual physical  exam - POCT CBC - POCT UA - Microscopic Only - POCT urinalysis dipstick - BMP8+EGFR - Hepatic function panel - NMR, lipoprofile - PSA, total and free - Vit D  25 hydroxy (rtn osteoporosis monitoring)  Patient Instructions  Continue current medications. Continue good therapeutic lifestyle changes which include good diet and exercise. Fall precautions discussed with patient. If an FOBT was given today- please return it to our front desk. If you are over 34 years old - you may need Prevnar 86 or the adult Pneumonia vaccine.  Be sure a check with your insurance regarding the Prevnar vaccine. We will call you with the results of your lab work once those results are available Continue to exercise regularly Drink plenty of water and fluids   Arrie Senate MD

## 2013-08-26 ENCOUNTER — Other Ambulatory Visit: Payer: Self-pay | Admitting: Family Medicine

## 2013-08-26 LAB — CBC WITH DIFFERENTIAL
BASOS: 1 %
Basophils Absolute: 0.1 10*3/uL (ref 0.0–0.2)
EOS ABS: 0.3 10*3/uL (ref 0.0–0.4)
EOS: 7 %
HEMATOCRIT: 44.5 % (ref 37.5–51.0)
HEMOGLOBIN: 15.5 g/dL (ref 12.6–17.7)
Immature Grans (Abs): 0 10*3/uL (ref 0.0–0.1)
Immature Granulocytes: 0 %
LYMPHS ABS: 1.1 10*3/uL (ref 0.7–3.1)
Lymphs: 24 %
MCH: 28.5 pg (ref 26.6–33.0)
MCHC: 34.8 g/dL (ref 31.5–35.7)
MCV: 82 fL (ref 79–97)
MONOS ABS: 0.3 10*3/uL (ref 0.1–0.9)
Monocytes: 7 %
NEUTROS ABS: 2.8 10*3/uL (ref 1.4–7.0)
Neutrophils Relative %: 61 %
Platelets: 149 10*3/uL — ABNORMAL LOW (ref 150–379)
RBC: 5.43 x10E6/uL (ref 4.14–5.80)
RDW: 13.3 % (ref 12.3–15.4)
WBC: 4.6 10*3/uL (ref 3.4–10.8)

## 2013-08-26 LAB — BMP8+EGFR
BUN/Creatinine Ratio: 13 (ref 10–22)
BUN: 13 mg/dL (ref 8–27)
CO2: 26 mmol/L (ref 18–29)
Calcium: 9.4 mg/dL (ref 8.6–10.2)
Chloride: 101 mmol/L (ref 97–108)
Creatinine, Ser: 1.01 mg/dL (ref 0.76–1.27)
GFR calc non Af Amer: 79 mL/min/{1.73_m2} (ref >59)
GFR, EST AFRICAN AMERICAN: 91 mL/min/{1.73_m2} (ref >59)
GLUCOSE: 95 mg/dL (ref 65–99)
POTASSIUM: 4 mmol/L (ref 3.5–5.2)
SODIUM: 141 mmol/L (ref 134–144)

## 2013-08-26 LAB — NMR, LIPOPROFILE
CHOLESTEROL: 180 mg/dL (ref 100–199)
HDL Cholesterol by NMR: 39 mg/dL — ABNORMAL LOW (ref >39)
HDL Particle Number: 28.4 umol/L — ABNORMAL LOW (ref >=30.5)
LDL PARTICLE NUMBER: 1491 nmol/L — AB (ref <1000)
LDL SIZE: 20.6 nm (ref >20.5)
LDLC SERPL CALC-MCNC: 125 mg/dL — ABNORMAL HIGH (ref 0–99)
LP-IR Score: 44 (ref <=45)
SMALL LDL PARTICLE NUMBER: 745 nmol/L — AB (ref <=527)
TRIGLYCERIDES BY NMR: 79 mg/dL (ref 0–149)

## 2013-08-26 LAB — HEPATIC FUNCTION PANEL
ALT: 15 IU/L (ref 0–44)
AST: 21 IU/L (ref 0–40)
Albumin: 4.5 g/dL (ref 3.6–4.8)
Alkaline Phosphatase: 60 IU/L (ref 39–117)
BILIRUBIN DIRECT: 0.17 mg/dL (ref 0.00–0.40)
TOTAL PROTEIN: 6.7 g/dL (ref 6.0–8.5)
Total Bilirubin: 0.7 mg/dL (ref 0.0–1.2)

## 2013-08-26 LAB — PSA, TOTAL AND FREE
PSA, Free Pct: 24.3 %
PSA, Free: 0.68 ng/mL
PSA: 2.8 ng/mL (ref 0.0–4.0)

## 2013-08-26 LAB — VITAMIN D 25 HYDROXY (VIT D DEFICIENCY, FRACTURES): Vit D, 25-Hydroxy: 68.7 ng/mL (ref 30.0–100.0)

## 2013-08-26 MED ORDER — SIMVASTATIN 20 MG PO TABS: 20.0000 mg | ORAL_TABLET | Freq: Every day | ORAL | Status: DC

## 2013-12-29 ENCOUNTER — Ambulatory Visit: Payer: BC Managed Care – PPO | Admitting: Family Medicine

## 2014-01-03 ENCOUNTER — Encounter: Payer: Self-pay | Admitting: Family Medicine

## 2014-01-03 ENCOUNTER — Ambulatory Visit (INDEPENDENT_AMBULATORY_CARE_PROVIDER_SITE_OTHER): Payer: BC Managed Care – PPO | Admitting: Family Medicine

## 2014-01-03 VITALS — BP 134/80 | HR 70 | Temp 97.7°F | Ht 74.0 in | Wt 195.0 lb

## 2014-01-03 DIAGNOSIS — E559 Vitamin D deficiency, unspecified: Secondary | ICD-10-CM

## 2014-01-03 DIAGNOSIS — Z889 Allergy status to unspecified drugs, medicaments and biological substances status: Secondary | ICD-10-CM

## 2014-01-03 DIAGNOSIS — Z789 Other specified health status: Secondary | ICD-10-CM

## 2014-01-03 DIAGNOSIS — E785 Hyperlipidemia, unspecified: Secondary | ICD-10-CM

## 2014-01-03 DIAGNOSIS — N400 Enlarged prostate without lower urinary tract symptoms: Secondary | ICD-10-CM

## 2014-01-03 LAB — POCT CBC
GRANULOCYTE PERCENT: 70.6 % (ref 37–80)
HEMATOCRIT: 49.1 % (ref 43.5–53.7)
HEMOGLOBIN: 15.5 g/dL (ref 14.1–18.1)
Lymph, poc: 1.1 (ref 0.6–3.4)
MCH, POC: 27.4 pg (ref 27–31.2)
MCHC: 31.6 g/dL — AB (ref 31.8–35.4)
MCV: 86.6 fL (ref 80–97)
MPV: 6.8 fL (ref 0–99.8)
POC Granulocyte: 3.8 (ref 2–6.9)
POC LYMPH %: 20.4 % (ref 10–50)
Platelet Count, POC: 165 10*3/uL (ref 142–424)
RBC: 5.7 M/uL (ref 4.69–6.13)
RDW, POC: 12.9 %
WBC: 5.4 10*3/uL (ref 4.6–10.2)

## 2014-01-03 NOTE — Patient Instructions (Addendum)
Continue current medications. Continue good therapeutic lifestyle changes which include good diet and exercise. Fall precautions discussed with patient. If an FOBT was given today- please return it to our front desk. If you are over 63 years old - you may need Prevnar 13 or the adult Pneumonia vaccine.  Flu Shots will be available at our office starting mid- September. Please call and schedule a FLU CLINIC APPOINTMENT.   Please return the FOBT We will call you with your lab work results once those results are available

## 2014-01-03 NOTE — Progress Notes (Signed)
Subjective:    Patient ID: Manuel Kidd, male    DOB: 10/13/1950, 63 y.o.   MRN: [id number]  HPI Pt here for follow up and management of chronic medical problems. The patient is doing well with no specific complaints. It is important to note that he stopped his simvastatin 10 mg in September because of muscle aches in his lower legs. These symptoms have improved since stopping the simvastatin. He is due to return an FOBT and get lab work today. We will address the cholesterol issue when the lab work is returned. I will discuss this with him during the visit today about other options and trying other statin drugs. This is of course if his cholesterol is still elevated. The patient refuses to take the flu shot.         Patient Active Problem List   Diagnosis Date Noted  . Hyperlipidemia 08/16/2012  . BPH (benign prostatic hypertrophy) 08/16/2012  . Vitamin D deficiency   . Difficult airway for intubation    Outpatient Encounter Prescriptions as of 01/03/2014  Medication Sig  . aspirin 81 MG tablet Take 81 mg by mouth daily.  . Multiple Vitamin (MULTIVITAMIN) tablet Take 1 tablet by mouth daily.  . Omega-3 Fatty Acids (FISH OIL) 1000 MG CAPS Take 1 capsule by mouth daily. 5 days a week  . VITAMIN D, ERGOCALCIFEROL, PO Take 2,000 Units by mouth daily. 5 days a week  . [DISCONTINUED] simvastatin (ZOCOR) 10 MG tablet Take 1 tablet by mouth Daily.  . [DISCONTINUED] simvastatin (ZOCOR) 20 MG tablet Take 1 tablet (20 mg total) by mouth at bedtime.    Review of Systems  Constitutional: Negative.   HENT: Negative.   Eyes: Negative.   Respiratory: Negative.   Cardiovascular: Negative.   Gastrointestinal: Negative.   Endocrine: Negative.   Genitourinary: Negative.   Musculoskeletal: Negative.   Skin: Negative.   Allergic/Immunologic: Negative.   Neurological: Negative.   Hematological: Negative.   Psychiatric/Behavioral: Negative.        Objective:   Physical Exam    Constitutional: He is oriented to person, place, and time. He appears well-developed and well-nourished. No distress.  The patient is pleasant and alert  HENT:  Head: Normocephalic and atraumatic.  Right Ear: External ear normal.  Left Ear: External ear normal.  Mouth/Throat: Oropharynx is clear and moist. No oropharyngeal exudate.  Minimal nasal congestion without complaints on the right side.  Eyes: Conjunctivae and EOM are normal. Pupils are equal, round, and reactive to light. Right eye exhibits no discharge. Left eye exhibits no discharge. No scleral icterus.  Neck: Normal range of motion. Neck supple. No thyromegaly present.  No carotid bruits or anterior cervical adenopathy  Cardiovascular: Normal rate, regular rhythm, normal heart sounds and intact distal pulses.  Exam reveals no friction rub.   No murmur heard. At 60/m  Pulmonary/Chest: Effort normal and breath sounds normal. No respiratory distress. He has no wheezes. He has no rales. He exhibits no tenderness.  No axillary adenopathy  Abdominal: Soft. Bowel sounds are normal. He exhibits no mass. There is no tenderness. There is no rebound and no guarding.  The abdomen is nontender without masses or organ enlargement  Musculoskeletal: Normal range of motion. He exhibits no edema.  Lymphadenopathy:    He has no cervical adenopathy.  Neurological: He is alert and oriented to person, place, and time. He has normal reflexes. No cranial nerve deficit.  Skin: Skin is warm and dry. No rash noted. No erythema. No  pallor.  Psychiatric: He has a normal mood and affect. His behavior is normal. Judgment and thought content normal.  Nursing note and vitals reviewed.  BP 134/80 mmHg  Pulse 70  Temp(Src) 97.7 F (36.5 C) (Oral)  Ht $R'6\' 2"'AK$  (1.88 m)  Wt 195 lb (88.451 kg)  BMI 25.03 kg/m2        Assessment & Plan:  1. BPH (benign prostatic hypertrophy) - POCT CBC  2. Hyperlipidemia - POCT CBC - BMP8+EGFR - Hepatic function  panel - NMR, lipoprofile  3. Vitamin D deficiency - POCT CBC - Vit D  25 hydroxy (rtn osteoporosis monitoring)  4. Statin intolerance -We will see what his cholesterol numbers are on the lab work today and we'll make decisions about whether to try another treatment when those results are back  Patient Instructions  Continue current medications. Continue good therapeutic lifestyle changes which include good diet and exercise. Fall precautions discussed with patient. If an FOBT was given today- please return it to our front desk. If you are over 71 years old - you may need Prevnar 69 or the adult Pneumonia vaccine.  Flu Shots will be available at our office starting mid- September. Please call and schedule a FLU CLINIC APPOINTMENT.   Please return the FOBT We will call you with your lab work results once those results are available   Arrie Senate MD

## 2014-01-04 ENCOUNTER — Telehealth: Payer: Self-pay | Admitting: *Deleted

## 2014-01-04 LAB — NMR, LIPOPROFILE
CHOLESTEROL: 183 mg/dL (ref 100–199)
HDL Cholesterol by NMR: 47 mg/dL (ref >39)
HDL Particle Number: 31.1 umol/L (ref >=30.5)
LDL Particle Number: 1294 nmol/L — ABNORMAL HIGH (ref <1000)
LDL SIZE: 21.2 nm (ref >20.5)
LDL-C: 125 mg/dL — ABNORMAL HIGH (ref 0–99)
LP-IR Score: 35 (ref <=45)
Small LDL Particle Number: 339 nmol/L (ref <=527)
Triglycerides by NMR: 55 mg/dL (ref 0–149)

## 2014-01-04 LAB — BMP8+EGFR
BUN/Creatinine Ratio: 16 (ref 10–22)
BUN: 15 mg/dL (ref 8–27)
CHLORIDE: 100 mmol/L (ref 97–108)
CO2: 28 mmol/L (ref 18–29)
Calcium: 9.7 mg/dL (ref 8.6–10.2)
Creatinine, Ser: 0.93 mg/dL (ref 0.76–1.27)
GFR, EST AFRICAN AMERICAN: 101 mL/min/{1.73_m2} (ref >59)
GFR, EST NON AFRICAN AMERICAN: 87 mL/min/{1.73_m2} (ref >59)
GLUCOSE: 88 mg/dL (ref 65–99)
Potassium: 4.8 mmol/L (ref 3.5–5.2)
Sodium: 141 mmol/L (ref 134–144)

## 2014-01-04 LAB — HEPATIC FUNCTION PANEL
ALT: 14 IU/L (ref 0–44)
AST: 23 IU/L (ref 0–40)
Albumin: 4.6 g/dL (ref 3.6–4.8)
Alkaline Phosphatase: 69 IU/L (ref 39–117)
BILIRUBIN DIRECT: 0.22 mg/dL (ref 0.00–0.40)
TOTAL PROTEIN: 7 g/dL (ref 6.0–8.5)
Total Bilirubin: 0.9 mg/dL (ref 0.0–1.2)

## 2014-01-04 LAB — VITAMIN D 25 HYDROXY (VIT D DEFICIENCY, FRACTURES): Vit D, 25-Hydroxy: 61.8 ng/mL (ref 30.0–100.0)

## 2014-01-04 NOTE — Telephone Encounter (Signed)
-----   Message from Donald W Moore, MD sent at 01/04/2014  7:25 AM EST ----- The blood sugar is good at 88. The creatinine the most important kidney function test is within normal limits. The electrolytes including potassium are within normal limits. All liver function tests are within normal limits Cholesterol numbers with advanced lipid testing have an elevated he'll particle number that is elevated at 1294. This is however improved from 4 months ago when it was 1491. The LDL C is elevated at 125 and the should be less than 100. The triglycerides are good at 55 and the good cholesterol is better than it was 4 months ago.----We will continue to leave off the simvastatin and recheck your cholesterol panel at the next visit if the LDL C does not go down below 100, we will have to try a different statin drug. In the meantime as mentioned continue aggressive therapeutic lifestyle changes. The vitamin D level is good at 61.8.----- continue current treatment 

## 2014-01-05 NOTE — Telephone Encounter (Signed)
-----   Message from Donald W Moore, MD sent at 01/04/2014  7:25 AM EST ----- The blood sugar is good at 88. The creatinine the most important kidney function test is within normal limits. The electrolytes including potassium are within normal limits. All liver function tests are within normal limits Cholesterol numbers with advanced lipid testing have an elevated he'll particle number that is elevated at 1294. This is however improved from 4 months ago when it was 1491. The LDL C is elevated at 125 and the should be less than 100. The triglycerides are good at 55 and the good cholesterol is better than it was 4 months ago.----We will continue to leave off the simvastatin and recheck your cholesterol panel at the next visit if the LDL C does not go down below 100, we will have to try a different statin drug. In the meantime as mentioned continue aggressive therapeutic lifestyle changes. The vitamin D level is good at 61.8.----- continue current treatment 

## 2014-01-05 NOTE — Telephone Encounter (Signed)
-----   Message from Ernestina Pennaonald W Moore, MD sent at 01/04/2014  7:25 AM EST ----- The blood sugar is good at 88. The creatinine the most important kidney function test is within normal limits. The electrolytes including potassium are within normal limits. All liver function tests are within normal limits Cholesterol numbers with advanced lipid testing have an elevated he'll particle number that is elevated at 1294. This is however improved from 4 months ago when it was 1491. The LDL C is elevated at 125 and the should be less than 100. The triglycerides are good at 55 and the good cholesterol is better than it was 4 months ago.----We will continue to leave off the simvastatin and recheck your cholesterol panel at the next visit if the LDL C does not go down below 100, we will have to try a different statin drug. In the meantime as mentioned continue aggressive therapeutic lifestyle changes. The vitamin D level is good at 61.8.----- continue current treatment

## 2014-01-05 NOTE — Telephone Encounter (Signed)
Letter sent with results

## 2014-05-16 ENCOUNTER — Ambulatory Visit: Payer: BC Managed Care – PPO | Admitting: Family Medicine

## 2014-05-22 ENCOUNTER — Encounter: Payer: Self-pay | Admitting: Family Medicine

## 2014-05-22 ENCOUNTER — Ambulatory Visit (INDEPENDENT_AMBULATORY_CARE_PROVIDER_SITE_OTHER): Payer: BC Managed Care – PPO | Admitting: Family Medicine

## 2014-05-22 VITALS — BP 119/78 | HR 71 | Temp 97.6°F | Ht 74.0 in | Wt 198.0 lb

## 2014-05-22 DIAGNOSIS — N400 Enlarged prostate without lower urinary tract symptoms: Secondary | ICD-10-CM | POA: Diagnosis not present

## 2014-05-22 DIAGNOSIS — E559 Vitamin D deficiency, unspecified: Secondary | ICD-10-CM

## 2014-05-22 DIAGNOSIS — Z889 Allergy status to unspecified drugs, medicaments and biological substances status: Secondary | ICD-10-CM | POA: Diagnosis not present

## 2014-05-22 DIAGNOSIS — E785 Hyperlipidemia, unspecified: Secondary | ICD-10-CM

## 2014-05-22 DIAGNOSIS — Z789 Other specified health status: Secondary | ICD-10-CM

## 2014-05-22 DIAGNOSIS — Z8249 Family history of ischemic heart disease and other diseases of the circulatory system: Secondary | ICD-10-CM

## 2014-05-22 NOTE — Patient Instructions (Signed)
The patient should continue with aggressive therapeutic lifestyle changes He should exercise regularly and drink plenty of water He should be careful not to put himself at risk for falling and avoid climbing as much as possible He should continue with his current dose of vitamin D and omega-3 fatty acids pending results of lab work

## 2014-05-22 NOTE — Progress Notes (Signed)
Subjective:    Patient ID: Manuel Kidd, male    DOB: [birth year], 64 y.o.   MRN: [id number]  HPI Pt here for follow up and management of chronic medical problems which includes hyperlipidemia. He is taking OTC medication only. The patient comes to the visit today with his wife. He has no specific complaints. He is due to have an FOBT returned and he will get lab work fasting. He denies chest pain shortness of breath or any problems with his gastrointestinal tract. He is passing his water without problems. He does have a history of BPH. The patient comes to the visit today with his wife. There is a family history of heart disease. Has not seen the cardiologist in several years. Recent stress tests were always positive and he ended up having to have a nuclear stress test. He is not having any chest pain but we will arrange for him to see the cardiologist again just for a routine visit with the positive family history.      Patient Active Problem List   Diagnosis Date Noted  . Hyperlipidemia 08/16/2012  . BPH (benign prostatic hypertrophy) 08/16/2012  . Vitamin D deficiency   . Difficult airway for intubation    Outpatient Encounter Prescriptions as of 05/22/2014  Medication Sig  . aspirin 81 MG tablet Take 81 mg by mouth daily.  . Multiple Vitamin (MULTIVITAMIN) tablet Take 1 tablet by mouth daily.  . Omega-3 Fatty Acids (FISH OIL) 1000 MG CAPS Take 1 capsule by mouth daily. 5 days a week  . VITAMIN D, ERGOCALCIFEROL, PO Take 2,000 Units by mouth daily. 5 days a week   No facility-administered encounter medications on file as of 05/22/2014.     Review of Systems  Constitutional: Negative.   HENT: Negative.   Eyes: Negative.   Respiratory: Negative.   Cardiovascular: Negative.   Gastrointestinal: Negative.   Endocrine: Negative.   Genitourinary: Negative.   Musculoskeletal: Negative.   Skin: Negative.   Allergic/Immunologic: Negative.   Neurological: Negative.   Hematological:  Negative.   Psychiatric/Behavioral: Negative.        Objective:   Physical Exam  Constitutional: He is oriented to person, place, and time. He appears well-developed and well-nourished.  HENT:  Head: Normocephalic and atraumatic.  Right Ear: External ear normal.  Left Ear: External ear normal.  Mouth/Throat: Oropharynx is clear and moist. No oropharyngeal exudate.  Minimal nasal congestion bilaterally  Eyes: Conjunctivae and EOM are normal. Pupils are equal, round, and reactive to light. Right eye exhibits no discharge. Left eye exhibits no discharge. No scleral icterus.  Neck: Normal range of motion. Neck supple. No thyromegaly present.  No carotid bruits  Cardiovascular: Normal rate, regular rhythm, normal heart sounds and intact distal pulses.  Exam reveals no gallop and no friction rub.   No murmur heard. At 72/m  Pulmonary/Chest: Effort normal and breath sounds normal. No respiratory distress. He has no wheezes. He has no rales. He exhibits no tenderness.  Clear anteriorly and posteriorly  Abdominal: Soft. Bowel sounds are normal. He exhibits no mass. There is no tenderness. There is no rebound and no guarding.  No abdominal bruits and no inguinal nodes with good inguinal pulses.  Musculoskeletal: Normal range of motion. He exhibits no edema.  Lymphadenopathy:    He has no cervical adenopathy.  Neurological: He is alert and oriented to person, place, and time. He has normal reflexes. No cranial nerve deficit.  Skin: Skin is warm and dry. No rash noted.  No erythema. No pallor.  Psychiatric: He has a normal mood and affect. His behavior is normal. Judgment and thought content normal.  Nursing note and vitals reviewed.  BP 119/78 mmHg  Pulse 71  Temp(Src) 97.6 F (36.4 C) (Oral)  Ht _0  (1.88 m)  Wt 198 lb (89.812 kg)  BMI 25.41 kg/m2        Assessment & Plan:  1. BPH (benign prostatic hypertrophy) -The patient is having no problems with voiding. - POCT CBC  2.  Hyperlipidemia -He is following his diet because he is statin intolerant and is currently taking omega-3 fatty acids - POCT CBC - BMP8+EGFR - Hepatic function panel - NMR, lipoprofile  3. Vitamin D deficiency -He should continue the current dose of vitamin D pending results of lab work that has yet to be drawn - POCT CBC - Vit D  25 hydroxy (rtn osteoporosis monitoring)  4. Statin intolerance -He will continue with aggressive therapeutic lifestyle changes - POCT CBC - NMR, lipoprofile  Patient Instructions  The patient should continue with aggressive therapeutic lifestyle changes He should exercise regularly and drink plenty of water He should be careful not to put himself at risk for falling and avoid climbing as much as possible He should continue with his current dose of vitamin D and omega-3 fatty acids pending results of lab work   Arrie Senate MD

## 2014-05-27 ENCOUNTER — Other Ambulatory Visit: Payer: BC Managed Care – PPO

## 2014-05-27 DIAGNOSIS — R109 Unspecified abdominal pain: Secondary | ICD-10-CM

## 2014-05-27 LAB — POCT CBC
Granulocyte percent: 67.2 %G (ref 37–80)
HCT, POC: 48.9 % (ref 43.5–53.7)
Hemoglobin: 16 g/dL (ref 14.1–18.1)
Lymph, poc: 1.3 (ref 0.6–3.4)
MCH: 27.7 pg (ref 27–31.2)
MCHC: 32.8 g/dL (ref 31.8–35.4)
MCV: 84.3 fL (ref 80–97)
MPV: 7.7 fL (ref 0–99.8)
POC Granulocyte: 3.6 (ref 2–6.9)
POC LYMPH %: 23.3 % (ref 10–50)
Platelet Count, POC: 151 10*3/uL (ref 142–424)
RBC: 5.8 M/uL (ref 4.69–6.13)
RDW, POC: 13.4 %
WBC: 5.4 10*3/uL (ref 4.6–10.2)

## 2014-05-29 ENCOUNTER — Telehealth: Payer: Self-pay | Admitting: *Deleted

## 2014-05-29 LAB — BMP8+EGFR
BUN / CREAT RATIO: 16 (ref 10–22)
BUN: 16 mg/dL (ref 8–27)
CALCIUM: 9.9 mg/dL (ref 8.6–10.2)
CO2: 23 mmol/L (ref 18–29)
Chloride: 99 mmol/L (ref 97–108)
Creatinine, Ser: 1 mg/dL (ref 0.76–1.27)
GFR calc Af Amer: 92 mL/min/{1.73_m2} (ref >59)
GFR calc non Af Amer: 80 mL/min/{1.73_m2} (ref >59)
Glucose: 99 mg/dL (ref 65–99)
POTASSIUM: 4.7 mmol/L (ref 3.5–5.2)
Sodium: 141 mmol/L (ref 134–144)

## 2014-05-29 LAB — NMR, LIPOPROFILE
Cholesterol: 210 mg/dL — ABNORMAL HIGH (ref 100–199)
HDL Cholesterol by NMR: 51 mg/dL (ref >39)
HDL Particle Number: 28.7 umol/L — ABNORMAL LOW (ref >=30.5)
LDL Particle Number: 1496 nmol/L — ABNORMAL HIGH (ref <1000)
LDL Size: 20.9 nm (ref >20.5)
LDL-C: 142 mg/dL — ABNORMAL HIGH (ref 0–99)
LP-IR Score: 44 (ref <=45)
Small LDL Particle Number: 390 nmol/L (ref <=527)
Triglycerides by NMR: 87 mg/dL (ref 0–149)

## 2014-05-29 LAB — HEPATIC FUNCTION PANEL
ALT: 17 IU/L (ref 0–44)
AST: 20 IU/L (ref 0–40)
Albumin: 4.7 g/dL (ref 3.6–4.8)
Alkaline Phosphatase: 61 IU/L (ref 39–117)
Bilirubin Total: 0.8 mg/dL (ref 0.0–1.2)
Bilirubin, Direct: 0.19 mg/dL (ref 0.00–0.40)
Total Protein: 7.2 g/dL (ref 6.0–8.5)

## 2014-05-29 LAB — VITAMIN D 25 HYDROXY (VIT D DEFICIENCY, FRACTURES): Vit D, 25-Hydroxy: 56 ng/mL (ref 30.0–100.0)

## 2014-05-29 NOTE — Telephone Encounter (Signed)
lmtcb regarding test results. 

## 2014-05-29 NOTE — Telephone Encounter (Signed)
-----   Message from Ernestina Pennaonald W Moore, MD sent at 05/28/2014 10:27 AM EDT ----- The CBC has a normal white blood cell count. The hemoglobin is good at 16.0 and the platelet count is adequate.

## 2014-05-30 LAB — FECAL OCCULT BLOOD, IMMUNOCHEMICAL: Fecal Occult Bld: NEGATIVE

## 2014-07-04 ENCOUNTER — Encounter: Payer: Self-pay | Admitting: Cardiology

## 2014-07-24 ENCOUNTER — Encounter: Payer: Self-pay | Admitting: Internal Medicine

## 2014-07-24 ENCOUNTER — Encounter: Payer: Self-pay | Admitting: Gastroenterology

## 2014-07-31 ENCOUNTER — Ambulatory Visit: Payer: BC Managed Care – PPO | Admitting: Cardiology

## 2014-08-10 ENCOUNTER — Encounter: Payer: Self-pay | Admitting: *Deleted

## 2014-10-06 ENCOUNTER — Ambulatory Visit (INDEPENDENT_AMBULATORY_CARE_PROVIDER_SITE_OTHER): Payer: BC Managed Care – PPO | Admitting: Cardiology

## 2014-10-06 ENCOUNTER — Encounter: Payer: Self-pay | Admitting: Cardiology

## 2014-10-06 VITALS — BP 128/80 | HR 68 | Ht 74.0 in | Wt 197.0 lb

## 2014-10-06 DIAGNOSIS — R0602 Shortness of breath: Secondary | ICD-10-CM

## 2014-10-06 DIAGNOSIS — E785 Hyperlipidemia, unspecified: Secondary | ICD-10-CM

## 2014-10-06 NOTE — Patient Instructions (Signed)
Your physician recommends that you schedule a follow-up appointment in: As Needed  Your physician recommends that yo have a CT Scoring Test

## 2014-10-06 NOTE — Progress Notes (Signed)
Cardiology Office Note   Date:  10/06/2014   ID:  Manuel Kidd, DOB July 01, 1950, MRN [id number]  PCP:  Manuel Heap, MD  Cardiologist:   Rollene Rotunda, MD   Chief Complaint  Patient presents with  . Family history of early CAD      History of Present Illness: Manuel Kidd is a 64 y.o. male who presents for evaluation of significant cardiovascular risk factors. I saw him about 10 years ago. He had a false-positive treadmill test with a negative stress perfusion test. This test was done because he had significant risk factors. Since that time the patient has done well. 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 With this he denies any cardiovascular symptoms other than mild SOB.  The patient denies any new symptoms such as chest discomfort, neck or arm discomfort. There has been no PND or orthopnea. There have been no reported palpitations, presyncope or syncope.  Past Medical History  Diagnosis Date  . Hyperlipidemia   . Difficult airway for intubation     per wife,during gallbladder sx  . Vitamin D deficiency     Past Surgical History  Procedure Laterality Date  . Cholecystectomy       Current Outpatient Prescriptions  Medication Sig Dispense Refill  . aspirin 81 MG tablet Take 81 mg by mouth daily.    . Multiple Vitamin (MULTIVITAMIN) tablet Take 1 tablet by mouth daily.    . Omega-3 Fatty Acids (FISH OIL) 1000 MG CAPS Take 1 capsule by mouth daily. 5 days a week    . VITAMIN D, ERGOCALCIFEROL, PO Take 2,000 Units by mouth daily. 5 days a week     No current facility-administered medications for this visit.    Allergies:   Review of patient's allergies indicates no known allergies.    Social History:  The patient  reports that he has never smoked. He has never used smokeless tobacco. He reports that he does not drink alcohol or use illicit drugs.   Family History:  The patient's family history includes CAD (age of onset: 47) in his brother; Colon polyps  in his father and mother; Heart failure in his mother. There is no history of Colon cancer.    ROS:  Please see the history of present illness.   Otherwise, review of systems are positive for none.   All other systems are reviewed and negative.    PHYSICAL EXAM: VS:  BP 128/80 mmHg  Pulse 68  Ht  (1.88 m)  Wt 197 lb (89.359 kg)  BMI 25.28 kg/m2 , BMI Body mass index is 25.28 kg/(m^2). GENERAL:  Well appearing HEENT:  Pupils equal round and reactive, fundi not visualized, oral mucosa unremarkable NECK:  No jugular venous distention, waveform within normal limits, carotid upstroke brisk and symmetric, no bruits, no thyromegaly LYMPHATICS:  No cervical, inguinal adenopathy LUNGS:  Clear to auscultation bilaterally BACK:  No CVA tenderness CHEST:  Unremarkable HEART:  PMI not displaced or sustained,S1 and S2 within normal limits, no S3, no S4, no clicks, no rubs, no murmurs ABD:  Flat, positive bowel sounds normal in frequency in pitch, no bruits, no rebound, no guarding, no midline pulsatile mass, no hepatomegaly, no splenomegaly EXT:  2 plus pulses throughout, no edema, no cyanosis no clubbing SKIN:  No rashes no nodules NEURO:  Cranial nerves II through XII grossly intact, motor grossly intact throughout PSYCH:  Cognitively intact, oriented to person place and time    EKG:  EKG is ordered today. The ekg ordered today demonstrates sinus rhythm, rate 68, axis within normal limits, intervals within normal limits, no acute ST-T wave changes.   Recent Labs: 05/27/2014: ALT 17; BUN 16; Creatinine, Ser 1.00; Hemoglobin 16.0; Potassium 4.7; Sodium 141    Lipid Panel    Component Value Date/Time   CHOL 210* 05/27/2014 0817   CHOL 129 04/07/2012 0848   TRIG 87 05/27/2014 0817   TRIG 59 04/07/2012 0848   HDL 51 05/27/2014 0817   HDL 39* 04/07/2012 0848   LDLCALC 125* 08/25/2013 0948   LDLCALC 78 04/07/2012 0848      Wt Readings from Last 3 Encounters:  10/06/14 197 lb  (89.359 kg)  05/22/14 198 lb (89.812 kg)  01/03/14 195 lb (88.451 kg)      Other studies Reviewed: Additional studies/ records that were reviewed today include: Labs. Review of the above records demonstrates:  Please see elsewhere in the note.    ASSESSMENT AND PLAN:  FAMILY HISTORY OF CAD:  Given his family history I think it is reasonable to screen him but he wouldn't be a have a treadmill test because of his previous false positive. Therefore, I will order coronary calcium score which will further guide our need for additional testing and primary risk reduction.  DYSLIPIDEMIA:  He had lipids of subfractionation.  His lipid profile was not quite as good as it had been previously. However, he deferred taking a statin but we will talk about this further pending his calcium score.  Current medicines are reviewed at length with the patient today.  The patient does not have concerns regarding medicines.  The following changes have been made:  no change  Labs/ tests ordered today include:   Orders Placed This Encounter  Procedures  . CT Cardiac Scoring  . EKG 12-Lead     Disposition:   FU with me as needed.      Signed, Rollene Rotunda, MD  10/06/2014 3:41 PM    [facility]

## 2014-10-09 ENCOUNTER — Ambulatory Visit (INDEPENDENT_AMBULATORY_CARE_PROVIDER_SITE_OTHER)
Admission: RE | Admit: 2014-10-09 | Discharge: 2014-10-09 | Disposition: A | Discharge status: Home / Self Care | Payer: Self-pay | Source: Ambulatory Visit | Attending: Cardiology | Admitting: Cardiology

## 2014-10-09 DIAGNOSIS — R0602 Shortness of breath: Secondary | ICD-10-CM

## 2014-11-22 ENCOUNTER — Encounter: Payer: Self-pay | Admitting: Family Medicine

## 2014-11-22 ENCOUNTER — Ambulatory Visit (HOSPITAL_COMMUNITY)
Admission: RE | Admit: 2014-11-22 | Discharge: 2014-11-22 | Disposition: A | Discharge status: Home / Self Care | Payer: BC Managed Care – PPO | Source: Ambulatory Visit | Attending: Family Medicine | Admitting: Family Medicine

## 2014-11-22 ENCOUNTER — Ambulatory Visit (INDEPENDENT_AMBULATORY_CARE_PROVIDER_SITE_OTHER): Payer: BC Managed Care – PPO | Admitting: Family Medicine

## 2014-11-22 ENCOUNTER — Other Ambulatory Visit: Payer: Self-pay | Admitting: *Deleted

## 2014-11-22 VITALS — BP 128/82 | HR 63 | Temp 97.6°F | Ht 74.0 in | Wt 196.0 lb

## 2014-11-22 DIAGNOSIS — N400 Enlarged prostate without lower urinary tract symptoms: Secondary | ICD-10-CM

## 2014-11-22 DIAGNOSIS — R05 Cough: Secondary | ICD-10-CM | POA: Diagnosis not present

## 2014-11-22 DIAGNOSIS — R911 Solitary pulmonary nodule: Secondary | ICD-10-CM | POA: Insufficient documentation

## 2014-11-22 DIAGNOSIS — R059 Cough, unspecified: Secondary | ICD-10-CM

## 2014-11-22 DIAGNOSIS — L570 Actinic keratosis: Secondary | ICD-10-CM

## 2014-11-22 DIAGNOSIS — Z0000 Encounter for general adult medical examination without abnormal findings: Secondary | ICD-10-CM

## 2014-11-22 DIAGNOSIS — J209 Acute bronchitis, unspecified: Secondary | ICD-10-CM

## 2014-11-22 DIAGNOSIS — E785 Hyperlipidemia, unspecified: Secondary | ICD-10-CM

## 2014-11-22 DIAGNOSIS — E559 Vitamin D deficiency, unspecified: Secondary | ICD-10-CM

## 2014-11-22 LAB — POCT URINALYSIS DIPSTICK
Bilirubin, UA: NEGATIVE
GLUCOSE UA: NEGATIVE
KETONES UA: NEGATIVE
Leukocytes, UA: NEGATIVE
Nitrite, UA: NEGATIVE
Protein, UA: NEGATIVE
RBC UA: NEGATIVE
SPEC GRAV UA: 1.01
UROBILINOGEN UA: NEGATIVE
pH, UA: 6.5

## 2014-11-22 LAB — POCT UA - MICROSCOPIC ONLY
BACTERIA, U MICROSCOPIC: NEGATIVE
CASTS, UR, LPF, POC: NEGATIVE
CRYSTALS, UR, HPF, POC: NEGATIVE
EPITHELIAL CELLS, URINE PER MICROSCOPY: NEGATIVE
RBC, urine, microscopic: NEGATIVE
WBC, Ur, HPF, POC: NEGATIVE
Yeast, UA: NEGATIVE

## 2014-11-22 MED ORDER — AZITHROMYCIN 250 MG PO TABS: ORAL_TABLET | ORAL | Status: DC

## 2014-11-22 MED ORDER — PREDNISONE 10 MG PO TABS: ORAL_TABLET | ORAL | Status: DC

## 2014-11-22 MED ORDER — METHYLPREDNISOLONE ACETATE 80 MG/ML IJ SUSP
60.0000 mg | Freq: Once | INTRAMUSCULAR | Status: AC
  Administered 2014-11-22: 60 mg via INTRAMUSCULAR

## 2014-11-22 NOTE — Patient Instructions (Addendum)
Medicare Annual Wellness Visit  [place] and the medical providers at [facility] strive to bring you the best medical care.  In doing so we not only want to address your current medical conditions and concerns but also to detect new conditions early and prevent illness, disease and health-related problems.    Medicare offers a yearly Wellness Visit which allows our clinical staff to assess your need for preventative services including immunizations, lifestyle education, counseling to decrease risk of preventable diseases and screening for fall risk and other medical concerns.    This visit is provided free of charge (no copay) for all Medicare recipients. The clinical pharmacists at [facility] have begun to conduct these Wellness Visits which will also include a thorough review of all your medications.    As you primary medical provider recommend that you make an appointment for your Annual Wellness Visit if you have not done so already this year.  You may set up this appointment before you leave today or you may call back [phone] and schedule an appointment.  Please make sure when you call that you mention that you are scheduling your Annual Wellness Visit with the clinical pharmacist so that the appointment may be made for the proper length of time.     Continue current medications. Continue good therapeutic lifestyle changes which include good diet and exercise. Fall precautions discussed with patient. If an FOBT was given today- please return it to our front desk. If you are over 64 years old - you may need Prevnar 13 or the adult Pneumonia vaccine.  **Flu shots are available--- please call and schedule a FLU-CLINIC appointment**  After your visit with us today you will receive a survey in the mail or online from [employer] regarding your care with us. Please take a moment to fill this out. Your feedback is very  important to us as you can help us better understand your patient needs as well as improve your experience and satisfaction. WE CARE ABOUT YOU!!!   Take antibiotic as directed Take prednisone as directed Use Flonase as directed Also use nasal saline frequently through the day in each nostril and take Mucinex maximum strength, blue and white in color, 1 twice daily with a large glass of water for cough and congestion Drink plenty of fluids and stay well hydrated Avoid irritating environments

## 2014-11-22 NOTE — Progress Notes (Signed)
Subjective:    Patient ID: Manuel Kidd, male    DOB: [birth year], 64 y.o.   MRN: [id number]  HPI Patient is here today for annual wellness exam and follow up of chronic medical problems which includes hyperlipidemia. He is taking medications regularly. The patient does complain of some cough off and on for a month and has noticed increased hoarseness. The patient does have some questions about his CT cardiac scoring and the results of that and some subpleural nodules that were found that may require additional CT scan. The patient has had this hoarseness and persistent cough off and on for one month. He does not recall having a lot of fever with this. The sputum is white in color and according to the patient is not purulent. He just can't seem to shake the cough. The patient denies chest pain. There is no more shortness of breath other than when he is having with the cough. He denies any GI symptoms heartburn indigestion nausea vomiting diarrhea or blood in the stool. He is passing his water without problems. His sexual performance is okay with the patient. He does wear glasses. He is in need of getting his Prevnar and flu vaccine but we will treat his current problem first before we give him these injections.       Patient Active Problem List   Diagnosis Date Noted  . Hyperlipidemia 08/16/2012  . BPH (benign prostatic hypertrophy) 08/16/2012  . Vitamin D deficiency   . Difficult airway for intubation    Outpatient Encounter Prescriptions as of 11/22/2014  Medication Sig  . aspirin 81 MG tablet Take 81 mg by mouth daily.  . Multiple Vitamin (MULTIVITAMIN) tablet Take 1 tablet by mouth daily.  . Omega-3 Fatty Acids (FISH OIL) 1000 MG CAPS Take 1 capsule by mouth daily. 5 days a week  . VITAMIN D, ERGOCALCIFEROL, PO Take 2,000 Units by mouth daily. 5 days a week   No facility-administered encounter medications on file as of 11/22/2014.     Review of Systems  Constitutional: Negative.     HENT: Positive for voice change. Trouble swallowing: hoarseness.   Eyes: Negative.   Respiratory: Positive for cough (off / on - x over 1 month).   Cardiovascular: Negative.   Gastrointestinal: Negative.   Endocrine: Negative.   Genitourinary: Negative.   Musculoskeletal: Negative.   Skin: Negative.   Allergic/Immunologic: Negative.   Neurological: Negative.   Hematological: Negative.   Psychiatric/Behavioral: Negative.        Objective:   Physical Exam  Constitutional: He is oriented to person, place, and time. He appears well-developed and well-nourished. No distress.  HENT:  Head: Normocephalic and atraumatic.  Right Ear: External ear normal.  Left Ear: External ear normal.  Mouth/Throat: Oropharynx is clear and moist. No oropharyngeal exudate.  Nasal turbinate swelling and congestion bilaterally  Eyes: Conjunctivae and EOM are normal. Pupils are equal, round, and reactive to light. Right eye exhibits no discharge. Left eye exhibits no discharge. No scleral icterus.  Neck: Normal range of motion. Neck supple. No thyromegaly present.  No carotid bruits thyromegaly or anterior cervical adenopathy  Cardiovascular: Normal rate, regular rhythm, normal heart sounds and intact distal pulses.   No murmur heard. The heart rate is 60/m and the rhythm is regular.  Pulmonary/Chest: Effort normal. No respiratory distress. He has wheezes (rare). He has no rales. He exhibits no tenderness.  The patient has a dry cough and no rales and rare wheezes  Abdominal: Soft. Bowel sounds  are normal. He exhibits no mass. There is no tenderness. There is no rebound and no guarding.  No liver or spleen enlargement no bruits and no inguinal adenopathy  Genitourinary: Rectum normal and penis normal.  The prostate was enlarged but soft and smooth. There were no rectal masses. There were no inguinal hernias and the external genitalia were within normal limits.  Musculoskeletal: Normal range of motion. He  exhibits no edema or tenderness.  Lymphadenopathy:    He has no cervical adenopathy.  Neurological: He is alert and oriented to person, place, and time. He has normal reflexes. No cranial nerve deficit.  Skin: Skin is warm and dry. No rash noted.  There were some early actinic keratoses on both temple areas above the TMJ area.  Psychiatric: He has a normal mood and affect. His behavior is normal. Judgment and thought content normal.  Nursing note and vitals reviewed.   BP 133/81 mmHg  Pulse 63  Temp(Src) 97.6 F (36.4 C) (Oral)  Ht $R'6\' 2"'Of$  (1.88 m)  Wt 196 lb (88.905 kg)  BMI 25.15 kg/m2       Assessment & Plan:  1. Hyperlipidemia -The patient will continue with his Omega 3 fish oil and aggressive therapeutic lifestyle changes pending results of lab work - BMP8+EGFR - CBC with Differential/Platelet - Hepatic function panel - NMR, lipoprofile  2. Vitamin D deficiency -Continue with vitamin D replacement any results of lab work - CBC with Differential/Platelet - Vit D  25 hydroxy (rtn osteoporosis monitoring)  3. BPH (benign prostatic hypertrophy) -The prostate was enlarged but soft and smooth and the patient is having no symptoms with this. - CBC with Differential/Platelet - PSA - POCT UA - Microscopic Only - POCT urinalysis dipstick  4. Annual physical exam -Patient will check with his insurance regarding the Prevnar vaccine and will return to get the flu shot once the current treatment for his bronchitis and bronchospasm has been completed - BMP8+EGFR - CBC with Differential/Platelet - Hepatic function panel - NMR, lipoprofile - Vit D  25 hydroxy (rtn osteoporosis monitoring) - PSA - POCT UA - Microscopic Only - POCT urinalysis dipstick  5. Cough -Drink plenty of fluids and take Mucinex twice daily as directed and use nasal saline. Also avoid irritating environments. - CT Chest Wo Contrast; Future - methylPREDNISolone acetate (DEPO-MEDROL) injection 60 mg; Inject  0.75 mLs (60 mg total) into the muscle once.  6. Lung nodule -Because of this finding on the calcium CT scoring with subpleural nodules we will get a more dedicated CT of the lungs and also because of the persistent cough. - CT Chest Wo Contrast; Future  7. Bronchitis with bronchospasm -Take antibiotic, prednisone, and Mucinex as directed  8. Actinic keratoses -The patient will return to clinic in a couple weeks and we will do cryotherapy on these actinic keratoses on each temple area.  Meds ordered this encounter  Medications  . methylPREDNISolone acetate (DEPO-MEDROL) injection 60 mg    Sig:   . azithromycin (ZITHROMAX) 250 MG tablet    Sig: As directed    Dispense:  6 each    Refill:  0  . predniSONE (DELTASONE) 10 MG tablet    Sig: TAPER- 1 tab by mouth four times daily for 2 days, 1 tab by mouth three times daily for 2 days, 1 tab by mouth twice a day for 2 days, then 1 tab by mouth daily for 2 days, then stop.    Dispense:  20 tablet  Refill:  0   Patient Instructions                       Medicare Annual Wellness Visit  [place] and the medical providers at [facility] strive to bring you the best medical care.  In doing so we not only want to address your current medical conditions and concerns but also to detect new conditions early and prevent illness, disease and health-related problems.    Medicare offers a yearly Wellness Visit which allows our clinical staff to assess your need for preventative services including immunizations, lifestyle education, counseling to decrease risk of preventable diseases and screening for fall risk and other medical concerns.    This visit is provided free of charge (no copay) for all Medicare recipients. The clinical pharmacists at [facility] have begun to conduct these Wellness Visits which will also include a thorough review of all your medications.    As you primary medical provider  recommend that you make an appointment for your Annual Wellness Visit if you have not done so already this year.  You may set up this appointment before you leave today or you may call back ([phone]) and schedule an appointment.  Please make sure when you call that you mention that you are scheduling your Annual Wellness Visit with the clinical pharmacist so that the appointment may be made for the proper length of time.     Continue current medications. Continue good therapeutic lifestyle changes which include good diet and exercise. Fall precautions discussed with patient. If an FOBT was given today- please return it to our front desk. If you are over 82 years old - you may need Prevnar 60 or the adult Pneumonia vaccine.  **Flu shots are available--- please call and schedule a FLU-CLINIC appointment**  After your visit with [place] today you will receive a survey in the mail or online from [employer] regarding your care with [place]. Please take a moment to fill this out. Your feedback is very important to [place] as you can help [place] better understand your patient needs as well as improve your experience and satisfaction. WE CARE ABOUT YOU!!!   Take antibiotic as directed Take prednisone as directed Use Flonase as directed Also use nasal saline frequently through the day in each nostril and take Mucinex maximum strength, blue and white in color, 1 twice daily with a large glass of water for cough and congestion Drink plenty of fluids and stay well hydrated Avoid irritating environments   Arrie Senate MD

## 2014-11-23 ENCOUNTER — Ambulatory Visit: Payer: BC Managed Care – PPO | Admitting: Family Medicine

## 2014-11-23 LAB — BMP8+EGFR
BUN / CREAT RATIO: 15 (ref 10–22)
BUN: 13 mg/dL (ref 8–27)
CO2: 26 mmol/L (ref 18–29)
Calcium: 9.7 mg/dL (ref 8.6–10.2)
Chloride: 100 mmol/L (ref 97–106)
Creatinine, Ser: 0.89 mg/dL (ref 0.76–1.27)
GFR, EST AFRICAN AMERICAN: 104 mL/min/{1.73_m2} (ref >59)
GFR, EST NON AFRICAN AMERICAN: 90 mL/min/{1.73_m2} (ref >59)
Glucose: 92 mg/dL (ref 65–99)
POTASSIUM: 4.6 mmol/L (ref 3.5–5.2)
SODIUM: 141 mmol/L (ref 136–144)

## 2014-11-23 LAB — NMR, LIPOPROFILE
Cholesterol: 177 mg/dL (ref 100–199)
HDL CHOLESTEROL BY NMR: 46 mg/dL (ref >39)
HDL PARTICLE NUMBER: 29.8 umol/L — AB (ref >=30.5)
LDL Particle Number: 1299 nmol/L — ABNORMAL HIGH (ref <1000)
LDL Size: 20.8 nm (ref >20.5)
LDL-C: 119 mg/dL — AB (ref 0–99)
LP-IR Score: 42 (ref <=45)
SMALL LDL PARTICLE NUMBER: 294 nmol/L (ref <=527)
TRIGLYCERIDES BY NMR: 59 mg/dL (ref 0–149)

## 2014-11-23 LAB — CBC WITH DIFFERENTIAL/PLATELET
BASOS: 1 %
Basophils Absolute: 0.1 10*3/uL (ref 0.0–0.2)
EOS (ABSOLUTE): 0.4 10*3/uL (ref 0.0–0.4)
Eos: 9 %
Hematocrit: 45.7 % (ref 37.5–51.0)
Hemoglobin: 15.6 g/dL (ref 12.6–17.7)
IMMATURE GRANS (ABS): 0 10*3/uL (ref 0.0–0.1)
Immature Granulocytes: 0 %
LYMPHS ABS: 1.3 10*3/uL (ref 0.7–3.1)
LYMPHS: 26 %
MCH: 28.6 pg (ref 26.6–33.0)
MCHC: 34.1 g/dL (ref 31.5–35.7)
MCV: 84 fL (ref 79–97)
Monocytes Absolute: 0.3 10*3/uL (ref 0.1–0.9)
Monocytes: 6 %
NEUTROS ABS: 2.9 10*3/uL (ref 1.4–7.0)
Neutrophils: 58 %
PLATELETS: 185 10*3/uL (ref 150–379)
RBC: 5.45 x10E6/uL (ref 4.14–5.80)
RDW: 13.4 % (ref 12.3–15.4)
WBC: 5 10*3/uL (ref 3.4–10.8)

## 2014-11-23 LAB — HEPATIC FUNCTION PANEL
ALK PHOS: 69 IU/L (ref 39–117)
ALT: 17 IU/L (ref 0–44)
AST: 24 IU/L (ref 0–40)
Albumin: 4.7 g/dL (ref 3.6–4.8)
BILIRUBIN, DIRECT: 0.19 mg/dL (ref 0.00–0.40)
Bilirubin Total: 0.6 mg/dL (ref 0.0–1.2)
Total Protein: 7.4 g/dL (ref 6.0–8.5)

## 2014-11-23 LAB — PSA: Prostate Specific Ag, Serum: 3.4 ng/mL (ref 0.0–4.0)

## 2014-11-23 LAB — VITAMIN D 25 HYDROXY (VIT D DEFICIENCY, FRACTURES): Vit D, 25-Hydroxy: 54.9 ng/mL (ref 30.0–100.0)

## 2014-12-06 ENCOUNTER — Encounter: Payer: Self-pay | Admitting: Family Medicine

## 2014-12-06 ENCOUNTER — Ambulatory Visit (INDEPENDENT_AMBULATORY_CARE_PROVIDER_SITE_OTHER): Payer: BC Managed Care – PPO | Admitting: Family Medicine

## 2014-12-06 VITALS — BP 136/88 | HR 57 | Temp 97.3°F | Ht 74.0 in | Wt 192.0 lb

## 2014-12-06 DIAGNOSIS — E785 Hyperlipidemia, unspecified: Secondary | ICD-10-CM

## 2014-12-06 DIAGNOSIS — J40 Bronchitis, not specified as acute or chronic: Secondary | ICD-10-CM

## 2014-12-06 DIAGNOSIS — Z8249 Family history of ischemic heart disease and other diseases of the circulatory system: Secondary | ICD-10-CM | POA: Diagnosis not present

## 2014-12-06 DIAGNOSIS — J209 Acute bronchitis, unspecified: Secondary | ICD-10-CM

## 2014-12-06 DIAGNOSIS — L570 Actinic keratosis: Secondary | ICD-10-CM | POA: Diagnosis not present

## 2014-12-06 MED ORDER — ROSUVASTATIN CALCIUM 10 MG PO TABS: 10.0000 mg | ORAL_TABLET | Freq: Every day | ORAL | Status: DC

## 2014-12-06 NOTE — Patient Instructions (Addendum)
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 He does not have to be seen. If the medicine causes any muscle aches or myalgias and should be discontinued and call us and let us know that Continue with aggressive therapeutic lifestyle changes which include diet and exercise

## 2014-12-06 NOTE — Progress Notes (Signed)
Subjective:    Patient ID: Manuel Kidd, male    DOB: [birth year], 64 y.o.   MRN: [id number]  HPI Patient here today for follow up on cough, which is better. He also wants 2 skin lesions removed from face. He is accompanied today by his wife. The patient does indicate that his cough and congestion are better. 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 He thinks the nasal spray has also helped. He did have a CT scan and the CT scan did not show any lung parenchymal problems. He did however show a lymph node and recommended a follow-up exam in about 6 months and the patient is aware of this. He also has a couple of actinic keratoses on both temple areas that need to be frozen and he is back to get this done today also. He is otherwise having no problems.      Patient Active Problem List   Diagnosis Date Noted  . Hyperlipidemia 08/16/2012  . BPH (benign prostatic hypertrophy) 08/16/2012  . Vitamin D deficiency   . Difficult airway for intubation    Outpatient Encounter Prescriptions as of 12/06/2014  Medication Sig  . aspirin 81 MG tablet Take 81 mg by mouth daily.  . Multiple Vitamin (MULTIVITAMIN) tablet Take 1 tablet by mouth daily.  . Omega-3 Fatty Acids (FISH OIL) 1000 MG CAPS Take 1 capsule by mouth daily. 5 days a week  . VITAMIN D, ERGOCALCIFEROL, PO Take 2,000 Units by mouth daily. 5 days a week  . [DISCONTINUED] azithromycin (ZITHROMAX) 250 MG tablet As directed  . [DISCONTINUED] predniSONE (DELTASONE) 10 MG tablet TAPER- 1 tab by mouth four times daily for 2 days, 1 tab by mouth three times daily for 2 days, 1 tab by mouth twice a day for 2 days, then 1 tab by mouth daily for 2 days, then stop.   No facility-administered encounter medications on file as of 12/06/2014.      Review of Systems  Constitutional: Negative.   HENT: Negative.   Eyes: Negative.   Respiratory: Negative.   Cardiovascular: Negative.     Gastrointestinal: Negative.   Endocrine: Negative.   Genitourinary: Negative.   Musculoskeletal: Negative.   Skin: Negative.   Allergic/Immunologic: Negative.   Neurological: Negative.   Hematological: Negative.   Psychiatric/Behavioral: Negative.        Objective:   Physical Exam  Constitutional: He is oriented to person, place, and time. He appears well-developed and well-nourished. No distress.  HENT:  Head: Normocephalic and atraumatic.  Right Ear: External ear normal.  Left Ear: External ear normal.  Nose: Nose normal.  Mouth/Throat: Oropharynx is clear and moist. No oropharyngeal exudate.  Eyes: Conjunctivae and EOM are normal. Pupils are equal, round, and reactive to light. Right eye exhibits no discharge. Left eye exhibits no discharge. No scleral icterus.  Neck: Normal range of motion. Neck supple. No thyromegaly present.  Cardiovascular: Normal rate and regular rhythm.   No murmur heard. Pulmonary/Chest: Effort normal and breath sounds normal. No respiratory distress. He has no wheezes. He has no rales. He exhibits no tenderness.  Chest appears clear anteriorly and posteriorly and even with coughing there is less congestion and irritation since the prior visit.  Abdominal: Bowel sounds are normal. He exhibits no mass.  Musculoskeletal: Normal range of motion.  Lymphadenopathy:    He has no cervical adenopathy.  Neurological: He is alert and oriented to person, place, and time. No  cranial nerve deficit.  Skin: Skin is warm and dry. No rash noted.  There is a single actinic keratosis on each temple area. Cryotherapy was performed and the patient tolerated the procedure well.  Psychiatric: He has a normal mood and affect. His behavior is normal. Judgment and thought content normal.  Nursing note and vitals reviewed.   BP 136/88 mmHg  Pulse 57  Temp(Src) 97.3 F (36.3 C) (Oral)  Ht  (1.88 m)  Wt 192 lb (87.091 kg)  BMI 24.64 kg/m2       Assessment & Plan:   1. Hyperlipidemia -Because of the patient's calcium score and the family history of heart disease he will be tried again on a statin drug of Crestor 5 mg 1 daily at bedtime -He will come by in about 6 weeks for a traditional lipid liver panel fasting -He will discontinue this medicine if he develops any muscle aches or myalgias  2. Bronchitis with bronchospasm -He will continue to protect his airways by avoiding irritating environments -He will continue to stay well hydrated and use nasal saline and Mucinex regularly especially for the next weeks.  3. Actinic keratosis -Cryotherapy was performed to 2 actinic keratoses without complication or problem and the patient tolerated the procedure well.  4. Family history of heart disease -More aggressive management of cholesterol with statin drug and exercise and diet--- both the patient and his wife are aware of the need to do this.  Meds ordered this encounter  Medications  . rosuvastatin (CRESTOR) 10 MG tablet    Sig: Take 1 tablet (10 mg total) by mouth daily.    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 He does not have to be seen. If the medicine causes any muscle aches or myalgias and should be discontinued and call us and let us know  that Continue with aggressive therapeutic lifestyle changes which include diet and exercise   Nyra Capes MD

## 2015-04-19 ENCOUNTER — Ambulatory Visit: Payer: BC Managed Care – PPO | Admitting: Family Medicine

## 2015-04-23 ENCOUNTER — Telehealth: Payer: Self-pay | Admitting: Family Medicine

## 2015-04-23 DIAGNOSIS — R05 Cough: Secondary | ICD-10-CM

## 2015-04-23 DIAGNOSIS — R911 Solitary pulmonary nodule: Secondary | ICD-10-CM

## 2015-04-23 DIAGNOSIS — R059 Cough, unspecified: Secondary | ICD-10-CM

## 2015-04-23 NOTE — Telephone Encounter (Signed)
Referral has been ordered. [facility] due to cost being cheaper.

## 2015-04-26 NOTE — Telephone Encounter (Signed)
Order placed,authorized and sent to GI for scheduling

## 2015-05-09 ENCOUNTER — Ambulatory Visit
Admission: RE | Admit: 2015-05-09 | Discharge: 2015-05-09 | Disposition: A | Discharge status: Home / Self Care | Payer: BC Managed Care – PPO | Source: Ambulatory Visit | Attending: Family Medicine | Admitting: Family Medicine

## 2015-05-09 DIAGNOSIS — R911 Solitary pulmonary nodule: Secondary | ICD-10-CM

## 2015-05-09 DIAGNOSIS — R059 Cough, unspecified: Secondary | ICD-10-CM

## 2015-05-09 DIAGNOSIS — R05 Cough: Secondary | ICD-10-CM

## 2015-05-30 ENCOUNTER — Ambulatory Visit: Payer: BC Managed Care – PPO | Admitting: Family Medicine

## 2015-06-14 ENCOUNTER — Ambulatory Visit: Payer: BC Managed Care – PPO | Admitting: Family Medicine

## 2015-06-19 ENCOUNTER — Telehealth: Payer: Self-pay | Admitting: Family Medicine

## 2015-06-19 NOTE — Telephone Encounter (Signed)
Advised pt to discuss at appt tomorrow with [facility] and pt voiced understanding.

## 2015-06-20 ENCOUNTER — Encounter: Payer: Self-pay | Admitting: Family Medicine

## 2015-06-20 ENCOUNTER — Ambulatory Visit (INDEPENDENT_AMBULATORY_CARE_PROVIDER_SITE_OTHER): Payer: BC Managed Care – PPO | Admitting: Family Medicine

## 2015-06-20 VITALS — BP 129/77 | HR 60 | Temp 97.4°F | Ht 74.0 in | Wt 194.0 lb

## 2015-06-20 DIAGNOSIS — Z8249 Family history of ischemic heart disease and other diseases of the circulatory system: Secondary | ICD-10-CM | POA: Diagnosis not present

## 2015-06-20 DIAGNOSIS — N400 Enlarged prostate without lower urinary tract symptoms: Secondary | ICD-10-CM | POA: Diagnosis not present

## 2015-06-20 DIAGNOSIS — E559 Vitamin D deficiency, unspecified: Secondary | ICD-10-CM

## 2015-06-20 DIAGNOSIS — R911 Solitary pulmonary nodule: Secondary | ICD-10-CM | POA: Insufficient documentation

## 2015-06-20 DIAGNOSIS — E785 Hyperlipidemia, unspecified: Secondary | ICD-10-CM

## 2015-06-20 NOTE — Progress Notes (Signed)
Subjective:    Patient ID: Manuel Kidd, male    DOB: November 02, 1950, 65 y.o.   MRN: [id number]  HPI Pt here for follow up and management of chronic medical problems which includes hyperlipidemia. He is taking medications regularly. The patient is doing well and feeling well today with no complaints. He is due to return an FOBT and will get lab work done. He's had a chest CT that was done on April 26 indicating that they want to still follow the chest nodule in about 6 months and then again after that one more time as long as everything is stable. The patient denies chest pain shortness of breath trouble swallowing heartburn indigestion nausea vomiting diarrhea and blood in the stool. He is active physically and having no chest or heart issues. He has no joint problems neck pain back pain or knee pain. He is up-to-date on his eye exams.      Patient Active Problem List   Diagnosis Date Noted  . Hyperlipidemia 08/16/2012  . BPH (benign prostatic hypertrophy) 08/16/2012  . Vitamin D deficiency   . Difficult airway for intubation    Outpatient Encounter Prescriptions as of 06/20/2015  Medication Sig  . aspirin 81 MG tablet Take 81 mg by mouth daily.  . Multiple Vitamin (MULTIVITAMIN) tablet Take 1 tablet by mouth daily.  . Omega-3 Fatty Acids (FISH OIL) 1000 MG CAPS Take 1 capsule by mouth daily. 5 days a week  . rosuvastatin (CRESTOR) 10 MG tablet Take 1 tablet (10 mg total) by mouth daily.  Marland Kitchen VITAMIN D, ERGOCALCIFEROL, PO Take 2,000 Units by mouth daily. 5 days a week   No facility-administered encounter medications on file as of 06/20/2015.      Review of Systems  Constitutional: Negative.   HENT: Negative.   Eyes: Negative.   Respiratory: Negative.   Cardiovascular: Negative.   Gastrointestinal: Negative.   Endocrine: Negative.   Genitourinary: Negative.   Musculoskeletal: Negative.   Skin: Negative.   Allergic/Immunologic: Negative.   Neurological: Negative.   Hematological:  Negative.   Psychiatric/Behavioral: Negative.        Objective:   Physical Exam  Constitutional: He is oriented to person, place, and time. He appears well-developed and well-nourished.  HENT:  Head: Normocephalic and atraumatic.  Right Ear: External ear normal.  Left Ear: External ear normal.  Nose: Nose normal.  Mouth/Throat: Oropharynx is clear and moist. No oropharyngeal exudate.  Eyes: Conjunctivae and EOM are normal. Pupils are equal, round, and reactive to light. Right eye exhibits no discharge. Left eye exhibits no discharge. No scleral icterus.  Neck: Normal range of motion. Neck supple. No thyromegaly present.  Cardiovascular: Normal rate, regular rhythm, normal heart sounds and intact distal pulses.   No murmur heard. The heart was regular at 60/m  Pulmonary/Chest: Effort normal and breath sounds normal. No respiratory distress. He has no wheezes. He has no rales. He exhibits no tenderness.  No axillary adenopathy no chest wall masses  Abdominal: Soft. Bowel sounds are normal. He exhibits no mass. There is no tenderness. There is no rebound and no guarding.  No liver or spleen enlargement and no bruits  Musculoskeletal: Normal range of motion. He exhibits no edema or tenderness.  Lymphadenopathy:    He has no cervical adenopathy.  Neurological: He is alert and oriented to person, place, and time. He has normal reflexes. No cranial nerve deficit.  Skin: Skin is warm and dry. No rash noted.  Psychiatric: He has a normal mood  and affect. His behavior is normal. Judgment and thought content normal.  Nursing note and vitals reviewed.  BP 129/77 mmHg  Pulse 60  Temp(Src) 97.4 F (36.3 C) (Oral)  Ht '6\' 2"'$  (1.88 m)  Wt 194 lb (87.998 kg)  BMI 24.90 kg/m2        Assessment & Plan:  1. Vitamin D deficiency -Continue current treatment pending results of lab work - CBC with Differential/Platelet - VITAMIN D 25 Hydroxy (Vit-D Deficiency, Fractures)  2. BPH (benign  prostatic hypertrophy) -The patient is having no symptoms with voiding or with his prostate the present time. - CBC with Differential/Platelet  3. Family history of heart disease - CBC with Differential/Platelet  4. Hyperlipidemia -Continue aggressive therapeutic lifestyle changes and current treatment. - BMP8+EGFR - CBC with Differential/Platelet - Hepatic function panel - Lipid panel  5. Solitary pulmonary nodule -Repeat noncontrast chest CT around November 1 and then 18-24 months later. This is at the recommendation of the radiologist.  Patient Instructions                       Medicare Annual Wellness Visit  [place] and the medical providers at [facility] strive to bring you the best medical care.  In doing so we not only want to address your current medical conditions and concerns but also to detect new conditions early and prevent illness, disease and health-related problems.    Medicare offers a yearly Wellness Visit which allows our clinical staff to assess your need for preventative services including immunizations, lifestyle education, counseling to decrease risk of preventable diseases and screening for fall risk and other medical concerns.    This visit is provided free of charge (no copay) for all Medicare recipients. The clinical pharmacists at [facility] have begun to conduct these Wellness Visits which will also include a thorough review of all your medications.    As you primary medical provider recommend that you make an appointment for your Annual Wellness Visit if you have not done so already this year.  You may set up this appointment before you leave today or you may call back ([phone]) and schedule an appointment.  Please make sure when you call that you mention that you are scheduling your Annual Wellness Visit with the clinical pharmacist so that the appointment may be made for the proper length of time.      Continue current medications. Continue good therapeutic lifestyle changes which include good diet and exercise. Fall precautions discussed with patient. If an FOBT was given today- please return it to our front desk. If you are over 73 years old - you may need Prevnar 43 or the adult Pneumonia vaccine.  **Flu shots are available--- please call and schedule a FLU-CLINIC appointment**  After your visit with [place] today you will receive a survey in the mail or online from [employer] regarding your care with [place]. Please take a moment to fill this out. Your feedback is very important to [place] as you can help [place] better understand your patient needs as well as improve your experience and satisfaction. WE CARE ABOUT YOU!!!   Continue to stay active physically if any sign of any chest tightness or pressure call us immediately or get to the emergency room Check yourself regularly for ticks and use protection before getting out and the wooded areas are the grassy areas of your guarding. You'll need to have another chest CT for the pulmonary  nodule around November 1. If we do not call you regarding this appointment you call us in mid October to make sure this is arranged.    Arrie Senate MD

## 2015-06-20 NOTE — Patient Instructions (Addendum)
Medicare Annual Wellness Visit  [place] and the medical providers at [facility] strive to bring you the best medical care.  In doing so we not only want to address your current medical conditions and concerns but also to detect new conditions early and prevent illness, disease and health-related problems.    Medicare offers a yearly Wellness Visit which allows our clinical staff to assess your need for preventative services including immunizations, lifestyle education, counseling to decrease risk of preventable diseases and screening for fall risk and other medical concerns.    This visit is provided free of charge (no copay) for all Medicare recipients. The clinical pharmacists at [facility] have begun to conduct these Wellness Visits which will also include a thorough review of all your medications.    As you primary medical provider recommend that you make an appointment for your Annual Wellness Visit if you have not done so already this year.  You may set up this appointment before you leave today or you may call back [phone] and schedule an appointment.  Please make sure when you call that you mention that you are scheduling your Annual Wellness Visit with the clinical pharmacist so that the appointment may be made for the proper length of time.     Continue current medications. Continue good therapeutic lifestyle changes which include good diet and exercise. Fall precautions discussed with patient. If an FOBT was given today- please return it to our front desk. If you are over 65 years old - you may need Prevnar 13 or the adult Pneumonia vaccine.  **Flu shots are available--- please call and schedule a FLU-CLINIC appointment**  After your visit with us today you will receive a survey in the mail or online from [employer] regarding your care with us. Please take a moment to fill this out. Your feedback is very  important to us as you can help us better understand your patient needs as well as improve your experience and satisfaction. WE CARE ABOUT YOU!!!   Continue to stay active physically if any sign of any chest tightness or pressure call us immediately or get to the emergency room Check yourself regularly for ticks and use protection before getting out and the wooded areas are the grassy areas of your guarding. You'll need to have another chest CT for the pulmonary nodule around November 1. If we do not call you regarding this appointment you call us in mid October to make sure this is arranged.

## 2015-06-21 ENCOUNTER — Encounter: Payer: Self-pay | Admitting: Family Medicine

## 2015-06-21 DIAGNOSIS — D696 Thrombocytopenia, unspecified: Secondary | ICD-10-CM | POA: Insufficient documentation

## 2015-06-21 LAB — LIPID PANEL
CHOL/HDL RATIO: 2.7 ratio (ref 0.0–5.0)
Cholesterol, Total: 127 mg/dL (ref 100–199)
HDL: 47 mg/dL (ref >39)
LDL CALC: 69 mg/dL (ref 0–99)
Triglycerides: 53 mg/dL (ref 0–149)
VLDL CHOLESTEROL CAL: 11 mg/dL (ref 5–40)

## 2015-06-21 LAB — HEPATIC FUNCTION PANEL
ALBUMIN: 4.6 g/dL (ref 3.6–4.8)
ALT: 15 IU/L (ref 0–44)
AST: 24 IU/L (ref 0–40)
Alkaline Phosphatase: 59 IU/L (ref 39–117)
BILIRUBIN TOTAL: 0.5 mg/dL (ref 0.0–1.2)
BILIRUBIN, DIRECT: 0.17 mg/dL (ref 0.00–0.40)
TOTAL PROTEIN: 7.1 g/dL (ref 6.0–8.5)

## 2015-06-21 LAB — CBC WITH DIFFERENTIAL/PLATELET
Basophils Absolute: 0.1 10*3/uL (ref 0.0–0.2)
Basos: 1 %
EOS (ABSOLUTE): 0.4 10*3/uL (ref 0.0–0.4)
Eos: 8 %
Hematocrit: 44.6 % (ref 37.5–51.0)
Hemoglobin: 15.2 g/dL (ref 12.6–17.7)
IMMATURE GRANS (ABS): 0 10*3/uL (ref 0.0–0.1)
IMMATURE GRANULOCYTES: 0 %
LYMPHS: 27 %
Lymphocytes Absolute: 1.3 10*3/uL (ref 0.7–3.1)
MCH: 28.8 pg (ref 26.6–33.0)
MCHC: 34.1 g/dL (ref 31.5–35.7)
MCV: 85 fL (ref 79–97)
MONOS ABS: 0.3 10*3/uL (ref 0.1–0.9)
Monocytes: 6 %
NEUTROS PCT: 58 %
Neutrophils Absolute: 2.8 10*3/uL (ref 1.4–7.0)
PLATELETS: 148 10*3/uL — AB (ref 150–379)
RBC: 5.28 x10E6/uL (ref 4.14–5.80)
RDW: 14 % (ref 12.3–15.4)
WBC: 4.9 10*3/uL (ref 3.4–10.8)

## 2015-06-21 LAB — BMP8+EGFR
BUN/Creatinine Ratio: 17 (ref 10–24)
BUN: 15 mg/dL (ref 8–27)
CALCIUM: 9.5 mg/dL (ref 8.6–10.2)
CO2: 24 mmol/L (ref 18–29)
CREATININE: 0.88 mg/dL (ref 0.76–1.27)
Chloride: 101 mmol/L (ref 96–106)
GFR calc Af Amer: 105 mL/min/{1.73_m2} (ref >59)
GFR, EST NON AFRICAN AMERICAN: 91 mL/min/{1.73_m2} (ref >59)
GLUCOSE: 83 mg/dL (ref 65–99)
POTASSIUM: 4.6 mmol/L (ref 3.5–5.2)
SODIUM: 141 mmol/L (ref 134–144)

## 2015-06-21 LAB — VITAMIN D 25 HYDROXY (VIT D DEFICIENCY, FRACTURES): VIT D 25 HYDROXY: 60.8 ng/mL (ref 30.0–100.0)

## 2015-06-22 ENCOUNTER — Telehealth: Payer: Self-pay | Admitting: Family Medicine

## 2015-06-22 ENCOUNTER — Other Ambulatory Visit: Payer: BC Managed Care – PPO

## 2015-06-22 DIAGNOSIS — Z1211 Encounter for screening for malignant neoplasm of colon: Secondary | ICD-10-CM

## 2015-06-26 LAB — FECAL OCCULT BLOOD, IMMUNOCHEMICAL: Fecal Occult Bld: NEGATIVE

## 2015-10-22 ENCOUNTER — Telehealth: Payer: Self-pay | Admitting: Family Medicine

## 2015-10-22 NOTE — Telephone Encounter (Signed)
Please advise 

## 2015-10-23 ENCOUNTER — Other Ambulatory Visit: Payer: Self-pay | Admitting: Family Medicine

## 2015-10-23 DIAGNOSIS — R911 Solitary pulmonary nodule: Secondary | ICD-10-CM

## 2015-10-23 NOTE — Telephone Encounter (Signed)
Orders placed.

## 2015-11-15 ENCOUNTER — Ambulatory Visit
Admission: RE | Admit: 2015-11-15 | Discharge: 2015-11-15 | Disposition: A | Discharge status: Home / Self Care | Payer: BC Managed Care – PPO | Source: Ambulatory Visit | Attending: Family Medicine | Admitting: Family Medicine

## 2015-11-15 DIAGNOSIS — R911 Solitary pulmonary nodule: Secondary | ICD-10-CM

## 2015-11-16 ENCOUNTER — Encounter: Payer: Self-pay | Admitting: Family Medicine

## 2015-11-16 DIAGNOSIS — I700 Atherosclerosis of aorta: Secondary | ICD-10-CM | POA: Insufficient documentation

## 2015-12-19 ENCOUNTER — Encounter: Payer: Self-pay | Admitting: Family Medicine

## 2015-12-19 ENCOUNTER — Ambulatory Visit (INDEPENDENT_AMBULATORY_CARE_PROVIDER_SITE_OTHER): Payer: BC Managed Care – PPO | Admitting: Family Medicine

## 2015-12-19 VITALS — BP 133/79 | HR 64 | Temp 96.6°F | Ht 74.0 in | Wt 192.0 lb

## 2015-12-19 DIAGNOSIS — Z0000 Encounter for general adult medical examination without abnormal findings: Secondary | ICD-10-CM

## 2015-12-19 DIAGNOSIS — R911 Solitary pulmonary nodule: Secondary | ICD-10-CM

## 2015-12-19 DIAGNOSIS — Z8249 Family history of ischemic heart disease and other diseases of the circulatory system: Secondary | ICD-10-CM

## 2015-12-19 DIAGNOSIS — E7800 Pure hypercholesterolemia, unspecified: Secondary | ICD-10-CM

## 2015-12-19 DIAGNOSIS — Z789 Other specified health status: Secondary | ICD-10-CM

## 2015-12-19 DIAGNOSIS — E559 Vitamin D deficiency, unspecified: Secondary | ICD-10-CM

## 2015-12-19 DIAGNOSIS — I700 Atherosclerosis of aorta: Secondary | ICD-10-CM

## 2015-12-19 DIAGNOSIS — N400 Enlarged prostate without lower urinary tract symptoms: Secondary | ICD-10-CM

## 2015-12-19 LAB — URINALYSIS, COMPLETE
Bilirubin, UA: NEGATIVE
Glucose, UA: NEGATIVE
KETONES UA: NEGATIVE
LEUKOCYTES UA: NEGATIVE
Nitrite, UA: NEGATIVE
PROTEIN UA: NEGATIVE
RBC, UA: NEGATIVE
SPEC GRAV UA: 1.015 (ref 1.005–1.030)
Urobilinogen, Ur: 1 mg/dL (ref 0.2–1.0)
pH, UA: 7 (ref 5.0–7.5)

## 2015-12-19 LAB — MICROSCOPIC EXAMINATION
BACTERIA UA: NONE SEEN
EPITHELIAL CELLS (NON RENAL): NONE SEEN /HPF (ref 0–10)
RBC, UA: NONE SEEN /hpf (ref 0–?)
RENAL EPITHEL UA: NONE SEEN /HPF
WBC, UA: NONE SEEN /hpf (ref 0–?)

## 2015-12-19 NOTE — Patient Instructions (Addendum)
Continue current medications. Continue good therapeutic lifestyle changes which include good diet and exercise. Fall precautions discussed with patient. If an FOBT was given today- please return it to our front desk. If you are over 65 years old - you may need Prevnar 13 or the adult Pneumonia vaccine.  **Flu shots are available--- please call and schedule a FLU-CLINIC appointment**  After your visit with us today you will receive a survey in the mail or online from [employer] regarding your care with us. Please take a moment to fill this out. Your feedback is very important to us as you can help us better understand your patient needs as well as improve your experience and satisfaction. WE CARE ABOUT YOU!!!   The patient should continue with aggressive therapeutic lifestyle changes

## 2015-12-19 NOTE — Progress Notes (Signed)
Subjective:    Patient ID: Manuel Kidd, male    DOB: 02-01-1950, 65 y.o.   MRN: [id number]  HPI Patient is here today for annual wellness exam and follow up of chronic medical problems which includes hyperlipidemia. He is taking medications regularly. The patient is doing well overall. He has no specific complaints. He does have a history of aortic atherosclerosis. He has a history in his family of heart disease. He will get lab work today a rectal exam today and a urinalysis. His vital signs are stable and his weight is down a couple pounds since his last visit. She does have a stable pulmonary nodule since 10/09/2014. Future CT scans will be schedule 18-24 months out. The CT scan also revealed aortic atherosclerosis and coronary artery calcification. The patient denies any chest pain pressure or palpitations. He denies any shortness of breath. He has no GI symptoms including no nausea vomiting diarrhea or heartburn or indigestion. His stools were normal and there is no blood or black tarry stools. He's passing his water without problems and his sexual function is good. He is up-to-date on his eye exams. Because of the CT scan however we will get one more CT scan of the chest in about a year and that order will be put into the record.    Patient Active Problem List   Diagnosis Date Noted  . Aortic atherosclerosis ([place]) 11/16/2015  . Thrombocytopenia ([place]) 06/21/2015  . Solitary pulmonary nodule 06/20/2015  . Hyperlipidemia 08/16/2012  . BPH (benign prostatic hypertrophy) 08/16/2012  . Vitamin D deficiency   . Difficult airway for intubation    Outpatient Encounter Prescriptions as of 12/19/2015  Medication Sig  . aspirin 81 MG tablet Take 81 mg by mouth daily.  . Multiple Vitamin (MULTIVITAMIN) tablet Take 1 tablet by mouth daily.  . Omega-3 Fatty Acids (FISH OIL) 1000 MG CAPS Take 1 capsule by mouth daily. 5 days a week  . VITAMIN D, ERGOCALCIFEROL, PO Take 2,000 Units by mouth daily. 5  days a week  . [DISCONTINUED] rosuvastatin (CRESTOR) 10 MG tablet Take 1 tablet (10 mg total) by mouth daily.   No facility-administered encounter medications on file as of 12/19/2015.       Review of Systems  Constitutional: Negative.   HENT: Negative.   Eyes: Negative.   Respiratory: Negative.   Cardiovascular: Negative.   Gastrointestinal: Negative.   Endocrine: Negative.   Genitourinary: Negative.   Musculoskeletal: Negative.   Skin: Negative.   Allergic/Immunologic: Negative.   Neurological: Negative.   Hematological: Negative.   Psychiatric/Behavioral: Negative.        Objective:   Physical Exam  Constitutional: He is oriented to person, place, and time. He appears well-developed and well-nourished. No distress.  Pleasant and alert and still working  HENT:  Head: Normocephalic and atraumatic.  Right Ear: External ear normal.  Left Ear: External ear normal.  Mouth/Throat: Oropharynx is clear and moist. No oropharyngeal exudate.  Nasal turbinate congestion left greater than right  Eyes: Conjunctivae and EOM are normal. Pupils are equal, round, and reactive to light. Right eye exhibits no discharge. Left eye exhibits no discharge. No scleral icterus.  Eye exams up-to-date  Neck: Normal range of motion. Neck supple. No thyromegaly present.  No bruits thyromegaly or anterior cervical adenopathy  Cardiovascular: Normal rate, regular rhythm, normal heart sounds and intact distal pulses.   No murmur heard. Heart is regular at 72/m  Pulmonary/Chest: Effort normal and breath sounds normal. No respiratory distress. He  has no wheezes. He has no rales. He exhibits no tenderness.  No axillary adenopathy  Abdominal: Soft. Bowel sounds are normal. He exhibits no mass. There is no tenderness. There is no rebound and no guarding.  No abdominal masses or organ enlargement bruits or inguinal adenopathy  Genitourinary: Rectum normal and penis normal.  Genitourinary Comments: The  prostate is enlarged but smooth. There are no rectal masses. No inguinal hernias. External genitalia were within normal limits.  Musculoskeletal: Normal range of motion. He exhibits no edema.  Lymphadenopathy:    He has no cervical adenopathy.  Neurological: He is alert and oriented to person, place, and time. He has normal reflexes. No cranial nerve deficit.  Skin: Skin is warm and dry. No rash noted.  Psychiatric: He has a normal mood and affect. His behavior is normal. Judgment and thought content normal.  Nursing note and vitals reviewed.  BP 133/79 (BP Location: Left Arm)   Pulse 64   Temp (!) 96.6 F (35.9 C) (Oral)   Ht '6\' 2"'$  (1.88 m)   Wt 192 lb (87.1 kg)   BMI 24.65 kg/m         Assessment & Plan:  1. Annual physical exam -The patient is doing well today. His recent CT scan for the pulmonary nodules was stable. We will get one more in about a year. The CT scan does reemphasize that he has aortic atherosclerosis. He is statin intolerant. He would therefore have to continue with as aggressive therapeutic lifestyle changes as possible. He has a family history of heart disease. In his parents and brother. - Urinalysis, Complete - BMP8+EGFR - CBC with Differential/Platelet - Hepatic function panel - PSA, total and free - NMR, lipoprofile - VITAMIN D 25 Hydroxy (Vit-D Deficiency, Fractures)  2. Vitamin D deficiency -[place] current treatment pending results of lab work - CBC with Differential/Platelet - VITAMIN D 25 Hydroxy (Vit-D Deficiency, Fractures)  3. Family history of heart disease -Continue aggressive therapeutic lifestyle changes and keep all cholesterol numbers at goal if possible - BMP8+EGFR - CBC with Differential/Platelet - Hepatic function panel - NMR, lipoprofile  4. Pure hypercholesterolemia -Continue aggressive therapeutic lifestyle changes and if cholesterol is elevated consider trying a different statin at a low dose to see if he can tolerate  this. - BMP8+EGFR - CBC with Differential/Platelet - Hepatic function panel - NMR, lipoprofile  5. Statin intolerance -Check cholesterol numbers today and proceed depending on results and history.  6. Solitary pulmonary nodule -Repeat CT scan of the chest in 1 year  7. Benign prostatic hyperplasia without lower urinary tract symptoms -The patient has nocturia 1 and especially dependent upon his intake of fluids the night before.  8. Aortic atherosclerosis ([place]) -Aggressive therapeutic lifestyle changes with history of statin intolerance  Patient Instructions  Continue current medications. Continue good therapeutic lifestyle changes which include good diet and exercise. Fall precautions discussed with patient. If an FOBT was given today- please return it to our front desk. If you are over 77 years old - you may need Prevnar 64 or the adult Pneumonia vaccine.  **Flu shots are available--- please call and schedule a FLU-CLINIC appointment**  After your visit with [place] today you will receive a survey in the mail or online from [employer] regarding your care with [place]. Please take a moment to fill this out. Your feedback is very important to [place] as you can help [place] better understand your patient needs as well as improve your experience and satisfaction. WE  CARE ABOUT YOU!!!   The patient should continue with aggressive therapeutic lifestyle changes  Arrie Senate MD

## 2015-12-20 LAB — HEPATIC FUNCTION PANEL
ALBUMIN: 4.7 g/dL (ref 3.6–4.8)
ALK PHOS: 62 IU/L (ref 39–117)
ALT: 16 IU/L (ref 0–44)
AST: 26 IU/L (ref 0–40)
BILIRUBIN TOTAL: 0.9 mg/dL (ref 0.0–1.2)
BILIRUBIN, DIRECT: 0.21 mg/dL (ref 0.00–0.40)
TOTAL PROTEIN: 7.4 g/dL (ref 6.0–8.5)

## 2015-12-20 LAB — BMP8+EGFR
BUN/Creatinine Ratio: 16 (ref 10–24)
BUN: 14 mg/dL (ref 8–27)
CALCIUM: 9.9 mg/dL (ref 8.6–10.2)
CHLORIDE: 101 mmol/L (ref 96–106)
CO2: 28 mmol/L (ref 18–29)
Creatinine, Ser: 0.9 mg/dL (ref 0.76–1.27)
GFR, EST AFRICAN AMERICAN: 103 mL/min/{1.73_m2} (ref >59)
GFR, EST NON AFRICAN AMERICAN: 89 mL/min/{1.73_m2} (ref >59)
Glucose: 85 mg/dL (ref 65–99)
Potassium: 4.9 mmol/L (ref 3.5–5.2)
Sodium: 143 mmol/L (ref 134–144)

## 2015-12-20 LAB — NMR, LIPOPROFILE
CHOLESTEROL: 184 mg/dL (ref 100–199)
HDL CHOLESTEROL BY NMR: 49 mg/dL (ref >39)
HDL PARTICLE NUMBER: 26.8 umol/L — AB (ref >=30.5)
LDL PARTICLE NUMBER: 1150 nmol/L — AB (ref <1000)
LDL Size: 21.3 nm (ref >20.5)
LDL-C: 123 mg/dL — AB (ref 0–99)
LP-IR Score: 27 (ref <=45)
Small LDL Particle Number: 280 nmol/L (ref <=527)
TRIGLYCERIDES BY NMR: 59 mg/dL (ref 0–149)

## 2015-12-20 LAB — CBC WITH DIFFERENTIAL/PLATELET
BASOS ABS: 0.1 10*3/uL (ref 0.0–0.2)
Basos: 1 %
EOS (ABSOLUTE): 0.2 10*3/uL (ref 0.0–0.4)
Eos: 5 %
HEMOGLOBIN: 15.8 g/dL (ref 13.0–17.7)
Hematocrit: 47 % (ref 37.5–51.0)
IMMATURE GRANS (ABS): 0 10*3/uL (ref 0.0–0.1)
IMMATURE GRANULOCYTES: 0 %
LYMPHS: 25 %
Lymphocytes Absolute: 1.1 10*3/uL (ref 0.7–3.1)
MCH: 28.3 pg (ref 26.6–33.0)
MCHC: 33.6 g/dL (ref 31.5–35.7)
MCV: 84 fL (ref 79–97)
MONOCYTES: 7 %
Monocytes Absolute: 0.3 10*3/uL (ref 0.1–0.9)
NEUTROS ABS: 2.8 10*3/uL (ref 1.4–7.0)
NEUTROS PCT: 62 %
PLATELETS: 166 10*3/uL (ref 150–379)
RBC: 5.58 x10E6/uL (ref 4.14–5.80)
RDW: 13.6 % (ref 12.3–15.4)
WBC: 4.5 10*3/uL (ref 3.4–10.8)

## 2015-12-20 LAB — PSA, TOTAL AND FREE
PROSTATE SPECIFIC AG, SERUM: 2.9 ng/mL (ref 0.0–4.0)
PSA, Free Pct: 28.6 %
PSA, Free: 0.83 ng/mL

## 2015-12-20 LAB — VITAMIN D 25 HYDROXY (VIT D DEFICIENCY, FRACTURES): Vit D, 25-Hydroxy: 76.9 ng/mL (ref 30.0–100.0)

## 2016-06-17 ENCOUNTER — Ambulatory Visit (INDEPENDENT_AMBULATORY_CARE_PROVIDER_SITE_OTHER): Payer: BC Managed Care – PPO | Admitting: Family Medicine

## 2016-06-17 ENCOUNTER — Encounter: Payer: Self-pay | Admitting: Family Medicine

## 2016-06-17 VITALS — BP 125/79 | HR 58 | Temp 96.5°F | Ht 74.0 in | Wt 191.0 lb

## 2016-06-17 DIAGNOSIS — E7800 Pure hypercholesterolemia, unspecified: Secondary | ICD-10-CM | POA: Diagnosis not present

## 2016-06-17 DIAGNOSIS — E559 Vitamin D deficiency, unspecified: Secondary | ICD-10-CM

## 2016-06-17 DIAGNOSIS — R9389 Abnormal findings on diagnostic imaging of other specified body structures: Secondary | ICD-10-CM

## 2016-06-17 DIAGNOSIS — N400 Enlarged prostate without lower urinary tract symptoms: Secondary | ICD-10-CM

## 2016-06-17 DIAGNOSIS — R911 Solitary pulmonary nodule: Secondary | ICD-10-CM

## 2016-06-17 DIAGNOSIS — R938 Abnormal findings on diagnostic imaging of other specified body structures: Secondary | ICD-10-CM | POA: Diagnosis not present

## 2016-06-17 DIAGNOSIS — Z8249 Family history of ischemic heart disease and other diseases of the circulatory system: Secondary | ICD-10-CM

## 2016-06-17 NOTE — Patient Instructions (Addendum)
Medicare Annual Wellness Visit   and the medical providers at [facility] strive to bring you the best medical care.  In doing so we not only want to address your current medical conditions and concerns but also to detect new conditions early and prevent illness, disease and health-related problems.    Medicare offers a yearly Wellness Visit which allows our clinical staff to assess your need for preventative services including immunizations, lifestyle education, counseling to decrease risk of preventable diseases and screening for fall risk and other medical concerns.    This visit is provided free of charge (no copay) for all Medicare recipients. The clinical pharmacists at [facility] have begun to conduct these Wellness Visits which will also include a thorough review of all your medications.    As you primary medical provider recommend that you make an appointment for your Annual Wellness Visit if you have not done so already this year.  You may set up this appointment before you leave today or you may call back [phone] and schedule an appointment.  Please make sure when you call that you mention that you are scheduling your Annual Wellness Visit with the clinical pharmacist so that the appointment may be made for the proper length of time.    Continue current medications. Continue good therapeutic lifestyle changes which include good diet and exercise. Fall precautions discussed with patient. If an FOBT was given today- please return it to our front desk. If you are over 66 years old - you may need Prevnar 13 or the adult Pneumonia vaccine.  **Flu shots are available--- please call and schedule a FLU-CLINIC appointment**  After your visit with us today you will receive a survey in the mail or online from [employer] regarding your care with us. Please take a moment to fill this out. Your feedback is very  important to us as you can help us better understand your patient needs as well as improve your experience and satisfaction. WE CARE ABOUT YOU!!!  Do not forget to get your repeat CT by the end of September or early October. Stay active physically and stay well hydrated

## 2016-06-17 NOTE — Progress Notes (Signed)
Subjective:    Patient ID: Manuel Kidd, male    DOB: [birth year], 66 y.o.   MRN: [id number]  HPI Pt here for follow up and management of chronic medical problems which includes hyperlipidemia. He is taking medication regularly. The patient is doing well overall. He has a family history of heart disease. He has hyperlipidemia. He will get lab work today to follow-up on this. He has no specific complaints. The patient is doing well overall. 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 He denies any chest pain pressure shortness of breath. He has seen the cardiologist in the past because of his family history of heart disease and did have a nuclear stress test which was normal. He was told he did not have to come back unless he had any symptoms. He denies any trouble with swallowing heartburn indigestion nausea vomiting diarrhea or blood in the stool. He's passing his water without problems. He's recently had his eyes examined and everything was good. He is getting blood work today. He is due to have her to get a repeat chest CT sometime in late September of this year and this will be arranged for him at the visit today.    Patient Active Problem List   Diagnosis Date Noted  . Aortic atherosclerosis ([place]) 11/16/2015  . Thrombocytopenia ([place]) 06/21/2015  . Solitary pulmonary nodule 06/20/2015  . Hyperlipidemia 08/16/2012  . BPH (benign prostatic hypertrophy) 08/16/2012  . Vitamin D deficiency   . Difficult airway for intubation    Outpatient Encounter Prescriptions as of 06/17/2016  Medication Sig  . aspirin 81 MG tablet Take 81 mg by mouth daily.  . Multiple Vitamin (MULTIVITAMIN) tablet Take 1 tablet by mouth daily.  . Omega-3 Fatty Acids (FISH OIL) 1000 MG CAPS Take 1 capsule by mouth daily. 5 days a week  . VITAMIN D, ERGOCALCIFEROL, PO Take 2,000 Units by mouth daily. 5 days a week   No facility-administered encounter medications on file as of 06/17/2016.        Review of Systems  Constitutional: Negative.   HENT: Negative.   Eyes: Negative.   Respiratory: Negative.   Cardiovascular: Negative.   Gastrointestinal: Negative.   Endocrine: Negative.   Genitourinary: Negative.   Musculoskeletal: Negative.   Skin: Negative.   Allergic/Immunologic: Negative.   Neurological: Negative.   Hematological: Negative.   Psychiatric/Behavioral: Negative.        Objective:   Physical Exam  Constitutional: He is oriented to person, place, and time. He appears well-developed and well-nourished.  The patient is pleasant and alert  HENT:  Head: Normocephalic and atraumatic.  Right Ear: External ear normal.  Left Ear: External ear normal.  Nose: Nose normal.  Mouth/Throat: Oropharynx is clear and moist. No oropharyngeal exudate.  Eyes: Conjunctivae and EOM are normal. Pupils are equal, round, and reactive to light. Right eye exhibits no discharge. Left eye exhibits no discharge. No scleral icterus.  Neck: Normal range of motion. Neck supple. No thyromegaly present.  Cardiovascular: Normal rate, regular rhythm, normal heart sounds and intact distal pulses.   No murmur heard. The heart is 60/m with a regular rate and rhythm  Pulmonary/Chest: Effort normal and breath sounds normal. No respiratory distress. He has no wheezes. He has no rales. He exhibits no tenderness.  Clear anteriorly and posteriorly and no axillary adenopathy  Abdominal: Soft. Bowel sounds are normal. He exhibits no mass. There is no tenderness. There is no rebound and no  guarding.  No abdominal tenderness or organ enlargement bruits or masses.  Musculoskeletal: Normal range of motion. He exhibits no edema.  Lymphadenopathy:    He has no cervical adenopathy.  Neurological: He is alert and oriented to person, place, and time. He has normal reflexes. No cranial nerve deficit.  Skin: Skin is warm and dry. No rash noted.  Psychiatric: He has a normal mood and affect. His behavior is  normal. Judgment and thought content normal.  Nursing note and vitals reviewed.   BP 125/79 (BP Location: Left Arm)   Pulse (!) 58   Temp (!) 96.5 F (35.8 C) (Oral)   Ht '6\' 2"'$  (1.88 m)   Wt 191 lb (86.6 kg)   BMI 24.52 kg/m        Assessment & Plan:  1. Vitamin D deficiency -Continue current treatment pending results of lab work - CBC with Differential/Platelet - VITAMIN D 25 Hydroxy (Vit-D Deficiency, Fractures)  2. Family history of heart disease -Follow-up with cardiology if any symptoms occur - CBC with Differential/Platelet - BMP8+EGFR  3. Pure hypercholesterolemia -Continue aggressive therapeutic lifestyle changes and omega-3 fatty acids pending results of lab work - CBC with Differential/Platelet - BMP8+EGFR - Hepatic function panel - Lipid panel  4. Benign prostatic hyperplasia without lower urinary tract symptoms -No complaints today with prostate - CBC with Differential/Platelet  5. Abnormal chest CT -Order for CT scan I into September or early October  6. Solitary pulmonary nodule -Follow-up with CT scan as planned  No orders of the defined types were placed in this encounter.  Patient Instructions                       Medicare Annual Wellness Visit  [facility] and the medical providers at [facility] strive to bring you the best medical care.  In doing so we not only want to address your current medical conditions and concerns but also to detect new conditions early and prevent illness, disease and health-related problems.    Medicare offers a yearly Wellness Visit which allows our clinical staff to assess your need for preventative services including immunizations, lifestyle education, counseling to decrease risk of preventable diseases and screening for fall risk and other medical concerns.    This visit is provided free of charge (no copay) for all Medicare recipients. The clinical pharmacists at [facility] have begun to conduct these Wellness Visits which will also include a thorough review of all your medications.    As you primary medical provider recommend that you make an appointment for your Annual Wellness Visit if you have not done so already this year.  You may set up this appointment before you leave today or you may call back ([phone]) and schedule an appointment.  Please make sure when you call that you mention that you are scheduling your Annual Wellness Visit with the clinical pharmacist so that the appointment may be made for the proper length of time.    Continue current medications. Continue good therapeutic lifestyle changes which include good diet and exercise. Fall precautions discussed with patient. If an FOBT was given today- please return it to our front desk. If you are over 21 years old - you may need Prevnar 45 or the adult Pneumonia vaccine.  **Flu shots are available--- please call and schedule a FLU-CLINIC appointment**  After your visit with [place] today you will receive a survey in the mail or online from  [employer] regarding your care with [place]. Please take a moment to fill this out. Your feedback is very important to [place] as you can help [place] better understand your patient needs as well as improve your experience and satisfaction. WE CARE ABOUT YOU!!!  Do not forget to get your repeat CT by the end of September or early October. Stay active physically and stay well hydrated   Arrie Senate MD

## 2016-06-18 LAB — CBC WITH DIFFERENTIAL/PLATELET
Basophils Absolute: 0.1 10*3/uL (ref 0.0–0.2)
Basos: 1 %
EOS (ABSOLUTE): 0.2 10*3/uL (ref 0.0–0.4)
EOS: 5 %
HEMATOCRIT: 43.1 % (ref 37.5–51.0)
HEMOGLOBIN: 15.1 g/dL (ref 13.0–17.7)
IMMATURE GRANS (ABS): 0 10*3/uL (ref 0.0–0.1)
IMMATURE GRANULOCYTES: 0 %
LYMPHS ABS: 1.4 10*3/uL (ref 0.7–3.1)
LYMPHS: 26 %
MCH: 29.1 pg (ref 26.6–33.0)
MCHC: 35 g/dL (ref 31.5–35.7)
MCV: 83 fL (ref 79–97)
MONOCYTES: 6 %
Monocytes Absolute: 0.3 10*3/uL (ref 0.1–0.9)
Neutrophils Absolute: 3.2 10*3/uL (ref 1.4–7.0)
Neutrophils: 62 %
Platelets: 127 10*3/uL — ABNORMAL LOW (ref 150–379)
RBC: 5.19 x10E6/uL (ref 4.14–5.80)
RDW: 13.9 % (ref 12.3–15.4)
WBC: 5.2 10*3/uL (ref 3.4–10.8)

## 2016-06-18 LAB — LIPID PANEL
CHOL/HDL RATIO: 3.8 ratio (ref 0.0–5.0)
Cholesterol, Total: 174 mg/dL (ref 100–199)
HDL: 46 mg/dL (ref >39)
LDL CALC: 115 mg/dL — AB (ref 0–99)
Triglycerides: 64 mg/dL (ref 0–149)
VLDL CHOLESTEROL CAL: 13 mg/dL (ref 5–40)

## 2016-06-18 LAB — BMP8+EGFR
BUN/Creatinine Ratio: 17 (ref 10–24)
BUN: 16 mg/dL (ref 8–27)
CALCIUM: 9.5 mg/dL (ref 8.6–10.2)
CO2: 25 mmol/L (ref 18–29)
CREATININE: 0.96 mg/dL (ref 0.76–1.27)
Chloride: 102 mmol/L (ref 96–106)
GFR calc Af Amer: 95 mL/min/{1.73_m2} (ref >59)
GFR, EST NON AFRICAN AMERICAN: 83 mL/min/{1.73_m2} (ref >59)
Glucose: 86 mg/dL (ref 65–99)
Potassium: 4.5 mmol/L (ref 3.5–5.2)
Sodium: 143 mmol/L (ref 134–144)

## 2016-06-18 LAB — VITAMIN D 25 HYDROXY (VIT D DEFICIENCY, FRACTURES): Vit D, 25-Hydroxy: 69 ng/mL (ref 30.0–100.0)

## 2016-06-18 LAB — HEPATIC FUNCTION PANEL
ALBUMIN: 4.7 g/dL (ref 3.6–4.8)
ALK PHOS: 57 IU/L (ref 39–117)
ALT: 15 IU/L (ref 0–44)
AST: 26 IU/L (ref 0–40)
BILIRUBIN TOTAL: 0.8 mg/dL (ref 0.0–1.2)
BILIRUBIN, DIRECT: 0.2 mg/dL (ref 0.00–0.40)
TOTAL PROTEIN: 7 g/dL (ref 6.0–8.5)

## 2016-09-28 ENCOUNTER — Encounter: Payer: Self-pay | Admitting: Family Medicine

## 2016-09-29 ENCOUNTER — Other Ambulatory Visit: Payer: Self-pay | Admitting: *Deleted

## 2016-09-29 DIAGNOSIS — R911 Solitary pulmonary nodule: Secondary | ICD-10-CM

## 2016-10-07 ENCOUNTER — Other Ambulatory Visit: Payer: BC Managed Care – PPO

## 2016-10-08 ENCOUNTER — Other Ambulatory Visit: Payer: BC Managed Care – PPO

## 2016-10-10 ENCOUNTER — Ambulatory Visit
Admission: RE | Admit: 2016-10-10 | Discharge: 2016-10-10 | Disposition: A | Discharge status: Home / Self Care | Payer: BC Managed Care – PPO | Source: Ambulatory Visit | Attending: Family Medicine | Admitting: Family Medicine

## 2016-10-10 DIAGNOSIS — R911 Solitary pulmonary nodule: Secondary | ICD-10-CM

## 2016-10-11 ENCOUNTER — Encounter: Payer: Self-pay | Admitting: Family Medicine

## 2016-10-11 DIAGNOSIS — R918 Other nonspecific abnormal finding of lung field: Secondary | ICD-10-CM | POA: Insufficient documentation

## 2016-12-15 ENCOUNTER — Encounter: Payer: Self-pay | Admitting: Family Medicine

## 2016-12-15 ENCOUNTER — Ambulatory Visit: Payer: BC Managed Care – PPO | Admitting: Family Medicine

## 2016-12-15 VITALS — BP 123/78 | HR 63 | Temp 97.1°F | Ht 74.0 in | Wt 191.0 lb

## 2016-12-15 DIAGNOSIS — Z8249 Family history of ischemic heart disease and other diseases of the circulatory system: Secondary | ICD-10-CM

## 2016-12-15 DIAGNOSIS — R918 Other nonspecific abnormal finding of lung field: Secondary | ICD-10-CM

## 2016-12-15 DIAGNOSIS — I700 Atherosclerosis of aorta: Secondary | ICD-10-CM

## 2016-12-15 DIAGNOSIS — R911 Solitary pulmonary nodule: Secondary | ICD-10-CM

## 2016-12-15 DIAGNOSIS — N400 Enlarged prostate without lower urinary tract symptoms: Secondary | ICD-10-CM

## 2016-12-15 DIAGNOSIS — E559 Vitamin D deficiency, unspecified: Secondary | ICD-10-CM | POA: Diagnosis not present

## 2016-12-15 DIAGNOSIS — Z23 Encounter for immunization: Secondary | ICD-10-CM

## 2016-12-15 DIAGNOSIS — D696 Thrombocytopenia, unspecified: Secondary | ICD-10-CM

## 2016-12-15 DIAGNOSIS — E7800 Pure hypercholesterolemia, unspecified: Secondary | ICD-10-CM | POA: Diagnosis not present

## 2016-12-15 DIAGNOSIS — Z789 Other specified health status: Secondary | ICD-10-CM

## 2016-12-15 NOTE — Addendum Note (Signed)
Addended by: Trace Cederberg H on: 02/05/2016 09:09 AM   Modules accepted: Orders  

## 2016-12-15 NOTE — Progress Notes (Signed)
Subjective:    Patient ID: Manuel Kidd, male    DOB: [birth year], 66 y.o.   MRN: [id number]  HPI Pt here for follow up and management of chronic medical problems which includes hyperlipidemia. He is taking medication regularly.  The patient is doing well overall and has no specific need any refills.  He is due to return in FOBT.  He is also due to get lab work.  He refuses to take the flu shot but may go ahead and get his Prevnar vaccine today.  He is being followed primarily because of his hyperlipidemia and positive family history for heart disease and has aortic atherosclerosis.  His last CT scan of the chest because of a pulmonary nodule in the right middle lobe indicated that it is stable from the prior exam of 2017.  Follow-up CT scan was recommended for 12 months out which would be in September 2019.  Patient is pleasant and alert and denies any chest pain shortness of breath trouble swallowing heartburn indigestion nausea vomiting diarrhea blood in the stool or change in bowel habits.  There is no family history of colon cancer.  His last colonoscopy was good and was done by Dr. Elicia Lamp.  He is passing his water without problems.  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  His last colonoscopy was in 2013.     Patient Active Problem List   Diagnosis Date Noted  . Abnormal CT scan of lung 10/11/2016  . Aortic atherosclerosis ([place]) 11/16/2015  . Thrombocytopenia ([place]) 06/21/2015  . Solitary pulmonary nodule 06/20/2015  . Hyperlipidemia 08/16/2012  . BPH (benign prostatic hypertrophy) 08/16/2012  . Vitamin D deficiency   . Difficult airway for intubation    Outpatient Encounter Medications as of 12/15/2016  Medication Sig  . aspirin 81 MG tablet Take 81 mg by mouth daily.  . Multiple Vitamin (MULTIVITAMIN) tablet Take 1 tablet by mouth daily.  . Omega-3 Fatty Acids (FISH OIL) 1000 MG CAPS Take 1 capsule by mouth daily.  5 days a week  . VITAMIN D, ERGOCALCIFEROL, PO Take 2,000 Units by mouth daily. 5 days a week   No facility-administered encounter medications on file as of 12/15/2016.       Review of Systems  Constitutional: Negative.   HENT: Negative.   Eyes: Negative.   Respiratory: Negative.   Cardiovascular: Negative.   Gastrointestinal: Negative.   Endocrine: Negative.   Genitourinary: Negative.   Musculoskeletal: Negative.   Skin: Negative.   Allergic/Immunologic: Negative.   Neurological: Negative.   Hematological: Negative.   Psychiatric/Behavioral: Negative.        Objective:   Physical Exam  Constitutional: He is oriented to person, place, and time. He appears well-developed and well-nourished. No distress.  Patient is pleasant and relaxed and feeling well.  HENT:  Head: Normocephalic and atraumatic.  Right Ear: External ear normal.  Left Ear: External ear normal.  Mouth/Throat: Oropharynx is clear and moist. No oropharyngeal exudate.  Slight nasal congestion bilaterally  Eyes: Conjunctivae and EOM are normal. Pupils are equal, round, and reactive to light. Right eye exhibits no discharge. Left eye exhibits no discharge. No scleral icterus.  Recent eye exam in September and all was good  Neck: Normal range of motion. Neck supple. No thyromegaly present.  No thyromegaly anterior cervical adenopathy or bruits  Cardiovascular: Normal rate, regular rhythm, normal heart sounds and intact distal pulses.  No murmur heard. Heart is  regular at 72/min  Pulmonary/Chest: Effort normal and breath sounds normal. No respiratory distress. He has no wheezes. He has no rales. He exhibits no tenderness.  No axillary adenopathy and lungs were clear anteriorly and posteriorly  Abdominal: Soft. Bowel sounds are normal. He exhibits no mass. There is no tenderness. There is no rebound and no guarding.  No abdominal tenderness masses bruits or organ enlargement  Musculoskeletal: Normal range of  motion. He exhibits no edema.  Lymphadenopathy:    He has no cervical adenopathy.  Neurological: He is alert and oriented to person, place, and time. He has normal reflexes. No cranial nerve deficit.  Skin: Skin is warm and dry. No rash noted.  Psychiatric: He has a normal mood and affect. His behavior is normal. Judgment and thought content normal.  Nursing note and vitals reviewed.  BP 123/78 (BP Location: Left Arm)   Pulse 63   Temp (!) 97.1 F (36.2 C) (Oral)   Ht 6' 2" (1.88 m)   Wt 191 lb (86.6 kg)   BMI 24.52 kg/m         Assessment & Plan:  1. Vitamin D deficiency -Continue current treatment pending results of lab work - CBC with Differential/Platelet - VITAMIN D 25 Hydroxy (Vit-D Deficiency, Fractures)  2. Family history of heart disease -Continue with aggressive therapeutic lifestyle changes and omega-3 fatty acids - BMP8+EGFR - CBC with Differential/Platelet - Lipid panel - Hepatic function panel  3. Pure hypercholesterolemia -Continue with omega-3 fatty acids as patient is sensitive to statin drugs - CBC with Differential/Platelet - VITAMIN D 25 Hydroxy (Vit-D Deficiency, Fractures)  4. Benign prostatic hyperplasia without lower urinary tract symptoms -No complaints today with voiding - CBC with Differential/Platelet  5. Abnormal CT scan of lung -Next CT scan will be due in September 2019 to follow-up on pulmonary nodule  6. Statin intolerance -Continue with aggressive therapeutic lifestyle changes and omega-3 fatty acids  7. Aortic atherosclerosis ([place]) -Continue with therapeutic lifestyle changes and omega-3 fatty acids  8. Solitary pulmonary nodule -CT scan in September 2019  9. Thrombocytopenia ([place]) -No complaints with bleeding issues  Patient Instructions                       Medicare Annual Wellness Visit  [facility] and the medical providers at [facility] strive to bring you the best medical care.  In doing so  we not only want to address your current medical conditions and concerns but also to detect new conditions early and prevent illness, disease and health-related problems.    Medicare offers a yearly Wellness Visit which allows our clinical staff to assess your need for preventative services including immunizations, lifestyle education, counseling to decrease risk of preventable diseases and screening for fall risk and other medical concerns.    This visit is provided free of charge (no copay) for all Medicare recipients. The clinical pharmacists at [facility] have begun to conduct these Wellness Visits which will also include a thorough review of all your medications.    As you primary medical provider recommend that you make an appointment for your Annual Wellness Visit if you have not done so already this year.  You may set up this appointment before you leave today or you may call back [phone]) and schedule an appointment.  Please make sure when you call that you mention that you are scheduling your Annual Wellness Visit with the clinical pharmacist so that the  appointment may be made for the proper length of time.    Continue current medications. Continue good therapeutic lifestyle changes which include good diet and exercise. Fall precautions discussed with patient. If an FOBT was given today- please return it to our front desk. If you are over 75 years old - you may need Prevnar 66 or the adult Pneumonia vaccine.  **Flu shots are available--- please call and schedule a FLU-CLINIC appointment**  After your visit with [place] today you will receive a survey in the mail or online from [employer] regarding your care with [place]. Please take a moment to fill this out. Your feedback is very important to [place] as you can help [place] better understand your patient needs as well as improve your experience and satisfaction. WE CARE ABOUT YOU!!!     Arrie Senate MD

## 2016-12-15 NOTE — Patient Instructions (Signed)
Medicare Annual Wellness Visit  [facility] and the medical providers at [facility] strive to bring you the best medical care.  In doing so we not only want to address your current medical conditions and concerns but also to detect new conditions early and prevent illness, disease and health-related problems.    Medicare offers a yearly Wellness Visit which allows our clinical staff to assess your need for preventative services including immunizations, lifestyle education, counseling to decrease risk of preventable diseases and screening for fall risk and other medical concerns.    This visit is provided free of charge (no copay) for all Medicare recipients. The clinical pharmacists at [facility] have begun to conduct these Wellness Visits which will also include a thorough review of all your medications.    As you primary medical provider recommend that you make an appointment for your Annual Wellness Visit if you have not done so already this year.  You may set up this appointment before you leave today or you may call back [phone]) and schedule an appointment.  Please make sure when you call that you mention that you are scheduling your Annual Wellness Visit with the clinical pharmacist so that the appointment may be made for the proper length of time.     Continue current medications. Continue good therapeutic lifestyle changes which include good diet and exercise. Fall precautions discussed with patient. If an FOBT was given today- please return it to our front desk. If you are over 50 years old - you may need Prevnar 13 or the adult Pneumonia vaccine.  **Flu shots are available--- please call and schedule a FLU-CLINIC appointment**  After your visit with us today you will receive a survey in the mail or online from [employer] regarding your care with us. Please take a moment to fill this out. Your feedback is very  important to us as you can help us better understand your patient needs as well as improve your experience and satisfaction. WE CARE ABOUT YOU!!!    

## 2016-12-16 LAB — HEPATIC FUNCTION PANEL
ALT: 14 IU/L (ref 0–44)
AST: 24 IU/L (ref 0–40)
Albumin: 4.6 g/dL (ref 3.6–4.8)
Alkaline Phosphatase: 59 IU/L (ref 39–117)
BILIRUBIN TOTAL: 0.6 mg/dL (ref 0.0–1.2)
BILIRUBIN, DIRECT: 0.17 mg/dL (ref 0.00–0.40)
Total Protein: 6.8 g/dL (ref 6.0–8.5)

## 2016-12-16 LAB — CBC WITH DIFFERENTIAL/PLATELET
BASOS: 1 %
Basophils Absolute: 0.1 10*3/uL (ref 0.0–0.2)
EOS (ABSOLUTE): 0.3 10*3/uL (ref 0.0–0.4)
EOS: 9 %
Hematocrit: 45.3 % (ref 37.5–51.0)
Hemoglobin: 15.5 g/dL (ref 13.0–17.7)
IMMATURE GRANULOCYTES: 0 %
Immature Grans (Abs): 0 10*3/uL (ref 0.0–0.1)
Lymphocytes Absolute: 1 10*3/uL (ref 0.7–3.1)
Lymphs: 24 %
MCH: 28.9 pg (ref 26.6–33.0)
MCHC: 34.2 g/dL (ref 31.5–35.7)
MCV: 84 fL (ref 79–97)
MONOCYTES: 7 %
Monocytes Absolute: 0.3 10*3/uL (ref 0.1–0.9)
NEUTROS PCT: 59 %
Neutrophils Absolute: 2.4 10*3/uL (ref 1.4–7.0)
Platelets: 151 10*3/uL (ref 150–379)
RBC: 5.37 x10E6/uL (ref 4.14–5.80)
RDW: 13.8 % (ref 12.3–15.4)
WBC: 4 10*3/uL (ref 3.4–10.8)

## 2016-12-16 LAB — BMP8+EGFR
BUN/Creatinine Ratio: 11 (ref 10–24)
BUN: 11 mg/dL (ref 8–27)
CO2: 24 mmol/L (ref 20–29)
CREATININE: 1.02 mg/dL (ref 0.76–1.27)
Calcium: 9.6 mg/dL (ref 8.6–10.2)
Chloride: 104 mmol/L (ref 96–106)
GFR calc Af Amer: 88 mL/min/{1.73_m2} (ref >59)
GFR, EST NON AFRICAN AMERICAN: 76 mL/min/{1.73_m2} (ref >59)
GLUCOSE: 96 mg/dL (ref 65–99)
Potassium: 4.5 mmol/L (ref 3.5–5.2)
SODIUM: 142 mmol/L (ref 134–144)

## 2016-12-16 LAB — LIPID PANEL
Chol/HDL Ratio: 4.1 ratio (ref 0.0–5.0)
Cholesterol, Total: 187 mg/dL (ref 100–199)
HDL: 46 mg/dL (ref >39)
LDL Calculated: 130 mg/dL — ABNORMAL HIGH (ref 0–99)
Triglycerides: 54 mg/dL (ref 0–149)
VLDL CHOLESTEROL CAL: 11 mg/dL (ref 5–40)

## 2016-12-16 LAB — VITAMIN D 25 HYDROXY (VIT D DEFICIENCY, FRACTURES): VIT D 25 HYDROXY: 66 ng/mL (ref 30.0–100.0)

## 2017-06-15 ENCOUNTER — Ambulatory Visit (INDEPENDENT_AMBULATORY_CARE_PROVIDER_SITE_OTHER): Payer: BC Managed Care – PPO | Admitting: Family Medicine

## 2017-06-15 ENCOUNTER — Encounter: Payer: Self-pay | Admitting: Family Medicine

## 2017-06-15 VITALS — BP 122/78 | HR 63 | Temp 96.8°F | Ht 74.0 in | Wt 190.0 lb

## 2017-06-15 DIAGNOSIS — Z8249 Family history of ischemic heart disease and other diseases of the circulatory system: Secondary | ICD-10-CM | POA: Diagnosis not present

## 2017-06-15 DIAGNOSIS — Z1211 Encounter for screening for malignant neoplasm of colon: Secondary | ICD-10-CM

## 2017-06-15 DIAGNOSIS — D696 Thrombocytopenia, unspecified: Secondary | ICD-10-CM

## 2017-06-15 DIAGNOSIS — Z23 Encounter for immunization: Secondary | ICD-10-CM

## 2017-06-15 DIAGNOSIS — R911 Solitary pulmonary nodule: Secondary | ICD-10-CM

## 2017-06-15 DIAGNOSIS — E559 Vitamin D deficiency, unspecified: Secondary | ICD-10-CM

## 2017-06-15 DIAGNOSIS — E7800 Pure hypercholesterolemia, unspecified: Secondary | ICD-10-CM

## 2017-06-15 DIAGNOSIS — Z0000 Encounter for general adult medical examination without abnormal findings: Secondary | ICD-10-CM

## 2017-06-15 DIAGNOSIS — N400 Enlarged prostate without lower urinary tract symptoms: Secondary | ICD-10-CM

## 2017-06-15 DIAGNOSIS — Z789 Other specified health status: Secondary | ICD-10-CM

## 2017-06-15 DIAGNOSIS — I700 Atherosclerosis of aorta: Secondary | ICD-10-CM

## 2017-06-15 LAB — MICROSCOPIC EXAMINATION
Bacteria, UA: NONE SEEN
EPITHELIAL CELLS (NON RENAL): NONE SEEN /HPF (ref 0–10)
RBC, UA: NONE SEEN /hpf (ref 0–2)
RENAL EPITHEL UA: NONE SEEN /HPF
WBC, UA: NONE SEEN /hpf (ref 0–5)

## 2017-06-15 LAB — URINALYSIS, COMPLETE
BILIRUBIN UA: NEGATIVE
GLUCOSE, UA: NEGATIVE
Leukocytes, UA: NEGATIVE
NITRITE UA: NEGATIVE
Protein, UA: NEGATIVE
RBC UA: NEGATIVE
SPEC GRAV UA: 1.025 (ref 1.005–1.030)
UUROB: 1 mg/dL (ref 0.2–1.0)
pH, UA: 5.5 (ref 5.0–7.5)

## 2017-06-15 NOTE — Addendum Note (Signed)
Addended by: Magdalene RiverBULLINS, JAMIE H on: 06/15/2017 08:52 AM   Modules accepted: Orders

## 2017-06-15 NOTE — Progress Notes (Signed)
Subjective:    Patient ID: Manuel Kidd, male    DOB: 1950/08/21, 67 y.o.   MRN: [id number]  HPI Patient is here today for annual wellness exam and follow up of chronic medical problems which includes hyperlipidemia. He is taking medication regularly.  Patient is doing well overall and comes in today for a physical exam.  His next colonoscopy is not due until 2023.  He did have a lung nodule and repeat CT scan is due in September of this year.  His vital signs are stable his blood pressure is good and he is due to receive a Tdap.  He will also get an EKG.  The family history is positive for heart disease and his brother has had a heart attack.  It comes to the visit today with his wife.  She confirms that he is upfront and not having any problems that she is aware of.  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  He denies any chest pain pressure tightness or shortness of breath.  He denies any coughing wheezing.  He denies any trouble with swallowing heartburn indigestion nausea vomiting diarrhea blood in the stool black tarry bowel movements or change in bowel habits.  He did have a colonoscopy in 2013 and was told that he needed another one in 10 years and that colonoscopy was normal.  He denies any trouble with passing his water and may have nocturia x1 depending on how much he drinks prior to going to bed.  He sees the ophthalmologist once yearly and that will be again this fall.  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     Patient Active Problem List   Diagnosis Date Noted  . Abnormal CT scan of lung 10/11/2016  . Aortic atherosclerosis (HCC) 11/16/2015  . Thrombocytopenia (HCC) 06/21/2015  . Solitary pulmonary nodule 06/20/2015  . Hyperlipidemia 08/16/2012  . BPH (benign prostatic hypertrophy) 08/16/2012  . Vitamin D deficiency   . Difficult airway for intubation    Outpatient  Encounter Medications as of 06/15/2017  Medication Sig  . aspirin 81 MG tablet Take 81 mg by mouth daily.  . Multiple Vitamin (MULTIVITAMIN) tablet Take 1 tablet by mouth daily.  . Omega-3 Fatty Acids (FISH OIL) 1000 MG CAPS Take 1 capsule by mouth daily. 5 days a week  . VITAMIN D, ERGOCALCIFEROL, PO Take 2,000 Units by mouth daily. 5 days a week   No facility-administered encounter medications on file as of 06/15/2017.      Review of Systems  Constitutional: Negative.   HENT: Negative.   Eyes: Negative.   Respiratory: Negative.   Cardiovascular: Negative.   Gastrointestinal: Negative.   Endocrine: Negative.   Genitourinary: Negative.   Musculoskeletal: Negative.   Skin: Negative.   Allergic/Immunologic: Negative.   Neurological: Negative.   Hematological: Negative.   Psychiatric/Behavioral: Negative.        Objective:   Physical Exam  Constitutional: He is oriented to person, place, and time. He appears well-developed and well-nourished. No distress.  Patient is pleasant and alert and in good spirits with no specific complaints today.  He seems to be excited about retiring in July.  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  HENT:  Head: Normocephalic and atraumatic.  Right Ear: External ear normal.  Left Ear: External ear normal.  Mouth/Throat: Oropharynx is clear and moist. No oropharyngeal exudate.  Turbinate congestion bilaterally  Eyes: Pupils are equal, round, and reactive to light. Conjunctivae and EOM are normal. Right eye exhibits no discharge. Left eye exhibits no discharge. No scleral icterus.  Patient gets eyes examined every September  Neck: Normal range of motion. Neck supple. No thyromegaly present.  No bruits thyromegaly or anterior cervical adenopathy  Cardiovascular: Normal rate, regular rhythm, normal heart sounds and intact distal pulses.  No murmur heard. Heart has a regular rate and rhythm at 60/min  Pulmonary/Chest: Effort normal and breath sounds normal. He has  no wheezes. He has no rales. He exhibits no tenderness.  Clear anteriorly and posteriorly and no axillary adenopathy  Abdominal: Soft. Bowel sounds are normal. He exhibits no mass. There is no tenderness.  No abdominal tenderness masses organ enlargement bruits or inguinal adenopathy  Genitourinary: Rectum normal, prostate normal and penis normal.  Genitourinary Comments: Prostate is slightly enlarged but smooth and soft without lumps or masses.  The right side is slightly larger than the left.  Rectal exam was negative for masses in the external genitalia were within normal limits.  No inguinal hernias were palpated.  Musculoskeletal: Normal range of motion. He exhibits no edema.  Lymphadenopathy:    He has no cervical adenopathy.  Neurological: He is alert and oriented to person, place, and time. He has normal reflexes. No cranial nerve deficit.  Skin: Skin is warm and dry. Rash noted. There is erythema. No pallor.  The patient has some small bite areas on his right thigh and abdomen and he thinks this came from when he was mowing hay.  Psychiatric: He has a normal mood and affect. His behavior is normal. Judgment and thought content normal.  The patient's mood affect and behavior were all normal.  Nursing note and vitals reviewed.   BP 122/78 (BP Location: Right Arm)   Pulse 63   Temp (!) 96.8 F (36 C) (Oral)   Ht 6\' 2"  (1.88 m)   Wt 190 lb (86.2 kg)   BMI 24.39 kg/m   EKG with results pending===     Assessment & Plan:  1. Annual physical exam -Tdap will be given today -Patient will need colonoscopy repeat in 2023 -Patient will need repeat CT scan for pulmonary nodule in September or early October of this year.  2. Vitamin D deficiency -Continue vitamin D replacement pending results of lab work  3. Pure hypercholesterolemia -Continue with aggressive therapeutic lifestyle changes since he is statin intolerant.  4. Family history of heart disease -Continue with coated baby  aspirin once daily  5. Benign prostatic hyperplasia without lower urinary tract symptoms -Patient has occasional nocturia but no trouble with voiding essentially.  6. Aortic atherosclerosis (HCC) -Continue with aggressive therapeutic lifestyle changes  7. Solitary pulmonary nodule -Repeat CT scan in September or October  8. Thrombocytopenia (HCC) -No issues with bleeding or increased bruising noted by patient  9. Statin intolerance -Continue with aggressive therapeutic lifestyle changes  Patient Instructions  Continue current medications. Continue good therapeutic lifestyle changes which include good diet and exercise. Fall precautions discussed with patient. If an FOBT was given today- please return it to our front desk. If you are over 8 years old - you may need Prevnar 13 or the adult Pneumonia vaccine.  **Flu shots are available--- please call and schedule a FLU-CLINIC appointment**  After your visit with [place] today you will receive a survey in the mail or online from [employer] regarding your care with [place]. Please take a moment  to fill this out. Your feedback is very important to [place] as you can help [place] better understand your patient needs as well as improve your experience and satisfaction. WE CARE ABOUT YOU!!!   The patient should continue with as aggressive therapeutic lifestyle changes as possible including diet and exercise. He should stay active physically and the summer especially drink plenty of water and fluids and stay well-hydrated He will need a repeat CT scan of his lungs and September or early October of this year. He will need a repeat colonoscopy in 2023 for follow-up from 10 years ago. He will receive a Tdap today.  Nyra Capes MD

## 2017-06-15 NOTE — Patient Instructions (Addendum)
Continue current medications. Continue good therapeutic lifestyle changes which include good diet and exercise. Fall precautions discussed with patient. If an FOBT was given today- please return it to our front desk. If you are over 67 years old - you may need Prevnar 13 or the adult Pneumonia vaccine.  **Flu shots are available--- please call and schedule a FLU-CLINIC appointment**  After your visit with us today you will receive a survey in the mail or online from [employer] regarding your care with us. Please take a moment to fill this out. Your feedback is very important to us as you can help us better understand your patient needs as well as improve your experience and satisfaction. WE CARE ABOUT YOU!!!   The patient should continue with as aggressive therapeutic lifestyle changes as possible including diet and exercise. He should stay active physically and the summer especially drink plenty of water and fluids and stay well-hydrated He will need a repeat CT scan of his lungs and September or early October of this year. He will need a repeat colonoscopy in 2023 for follow-up from 10 years ago. He will receive a Tdap today.

## 2017-06-15 NOTE — Addendum Note (Signed)
Addended by: Magdalene RiverBULLINS, Jojo Pehl H on: 06/15/2017 03:01 PM   Modules accepted: Orders

## 2017-06-16 LAB — LIPID PANEL
CHOLESTEROL TOTAL: 161 mg/dL (ref 100–199)
Chol/HDL Ratio: 3.5 ratio (ref 0.0–5.0)
HDL: 46 mg/dL (ref >39)
LDL Calculated: 106 mg/dL — ABNORMAL HIGH (ref 0–99)
Triglycerides: 43 mg/dL (ref 0–149)
VLDL Cholesterol Cal: 9 mg/dL (ref 5–40)

## 2017-06-16 LAB — BMP8+EGFR
BUN/Creatinine Ratio: 16 (ref 10–24)
BUN: 15 mg/dL (ref 8–27)
CALCIUM: 9.1 mg/dL (ref 8.6–10.2)
CO2: 25 mmol/L (ref 20–29)
Chloride: 104 mmol/L (ref 96–106)
Creatinine, Ser: 0.91 mg/dL (ref 0.76–1.27)
GFR calc Af Amer: 101 mL/min/{1.73_m2} (ref >59)
GFR, EST NON AFRICAN AMERICAN: 88 mL/min/{1.73_m2} (ref >59)
Glucose: 99 mg/dL (ref 65–99)
Potassium: 4.3 mmol/L (ref 3.5–5.2)
SODIUM: 142 mmol/L (ref 134–144)

## 2017-06-16 LAB — CBC WITH DIFFERENTIAL/PLATELET
BASOS: 1 %
Basophils Absolute: 0.1 10*3/uL (ref 0.0–0.2)
EOS (ABSOLUTE): 0.4 10*3/uL (ref 0.0–0.4)
Eos: 9 %
Hematocrit: 43.6 % (ref 37.5–51.0)
Hemoglobin: 15.1 g/dL (ref 13.0–17.7)
IMMATURE GRANULOCYTES: 0 %
Immature Grans (Abs): 0 10*3/uL (ref 0.0–0.1)
LYMPHS: 22 %
Lymphocytes Absolute: 1 10*3/uL (ref 0.7–3.1)
MCH: 29 pg (ref 26.6–33.0)
MCHC: 34.6 g/dL (ref 31.5–35.7)
MCV: 84 fL (ref 79–97)
MONOS ABS: 0.3 10*3/uL (ref 0.1–0.9)
Monocytes: 7 %
NEUTROS PCT: 61 %
Neutrophils Absolute: 2.6 10*3/uL (ref 1.4–7.0)
PLATELETS: 155 10*3/uL (ref 150–450)
RBC: 5.21 x10E6/uL (ref 4.14–5.80)
RDW: 13.8 % (ref 12.3–15.4)
WBC: 4.4 10*3/uL (ref 3.4–10.8)

## 2017-06-16 LAB — HEPATIC FUNCTION PANEL
ALK PHOS: 58 IU/L (ref 39–117)
ALT: 16 IU/L (ref 0–44)
AST: 28 IU/L (ref 0–40)
Albumin: 4.5 g/dL (ref 3.6–4.8)
BILIRUBIN, DIRECT: 0.18 mg/dL (ref 0.00–0.40)
Bilirubin Total: 0.6 mg/dL (ref 0.0–1.2)
Total Protein: 6.7 g/dL (ref 6.0–8.5)

## 2017-06-16 LAB — THYROID PANEL WITH TSH
FREE THYROXINE INDEX: 1.9 (ref 1.2–4.9)
T3 Uptake Ratio: 23 % — ABNORMAL LOW (ref 24–39)
T4 TOTAL: 8.3 ug/dL (ref 4.5–12.0)
TSH: 1.7 u[IU]/mL (ref 0.450–4.500)

## 2017-06-16 LAB — PSA, TOTAL AND FREE
PROSTATE SPECIFIC AG, SERUM: 4 ng/mL (ref 0.0–4.0)
PSA FREE: 0.94 ng/mL
PSA, Free Pct: 23.5 %

## 2017-06-16 LAB — VITAMIN D 25 HYDROXY (VIT D DEFICIENCY, FRACTURES): VIT D 25 HYDROXY: 59.3 ng/mL (ref 30.0–100.0)

## 2017-06-16 MED ORDER — ROSUVASTATIN CALCIUM 5 MG PO TABS: 2.5000 mg | ORAL_TABLET | ORAL | 3 refills | Status: DC

## 2017-06-16 NOTE — Addendum Note (Signed)
Addended by: Magdalene RiverBULLINS, JAMIE H on: 06/16/2017 08:42 AM   Modules accepted: Orders

## 2017-06-17 LAB — FECAL OCCULT BLOOD, IMMUNOCHEMICAL: Fecal Occult Bld: NEGATIVE

## 2017-09-22 ENCOUNTER — Encounter: Payer: Self-pay | Admitting: Family Medicine

## 2017-09-22 ENCOUNTER — Other Ambulatory Visit: Payer: Self-pay | Admitting: *Deleted

## 2017-09-22 DIAGNOSIS — R911 Solitary pulmonary nodule: Secondary | ICD-10-CM

## 2017-09-22 DIAGNOSIS — R918 Other nonspecific abnormal finding of lung field: Secondary | ICD-10-CM

## 2017-10-13 ENCOUNTER — Ambulatory Visit
Admission: RE | Admit: 2017-10-13 | Discharge: 2017-10-13 | Disposition: A | Discharge status: Home / Self Care | Payer: Medicare Other | Source: Ambulatory Visit | Attending: Family Medicine | Admitting: Family Medicine

## 2017-10-13 DIAGNOSIS — R918 Other nonspecific abnormal finding of lung field: Secondary | ICD-10-CM

## 2017-10-13 DIAGNOSIS — R911 Solitary pulmonary nodule: Secondary | ICD-10-CM

## 2017-10-29 ENCOUNTER — Ambulatory Visit: Payer: BC Managed Care – PPO | Admitting: Family Medicine

## 2017-12-30 ENCOUNTER — Ambulatory Visit: Payer: Medicare Other | Admitting: Family Medicine

## 2017-12-30 ENCOUNTER — Encounter: Payer: Self-pay | Admitting: Family Medicine

## 2017-12-30 VITALS — BP 127/76 | HR 62 | Temp 96.8°F | Ht 74.0 in | Wt 193.0 lb

## 2017-12-30 DIAGNOSIS — E559 Vitamin D deficiency, unspecified: Secondary | ICD-10-CM

## 2017-12-30 DIAGNOSIS — Z8249 Family history of ischemic heart disease and other diseases of the circulatory system: Secondary | ICD-10-CM

## 2017-12-30 DIAGNOSIS — E7800 Pure hypercholesterolemia, unspecified: Secondary | ICD-10-CM

## 2017-12-30 DIAGNOSIS — I700 Atherosclerosis of aorta: Secondary | ICD-10-CM | POA: Diagnosis not present

## 2017-12-30 DIAGNOSIS — D696 Thrombocytopenia, unspecified: Secondary | ICD-10-CM

## 2017-12-30 NOTE — Patient Instructions (Addendum)
Medicare Annual Wellness Visit   and the medical providers at [facility] strive to bring you the best medical care.  In doing so we not only want to address your current medical conditions and concerns but also to detect new conditions early and prevent illness, disease and health-related problems.    Medicare offers a yearly Wellness Visit which allows our clinical staff to assess your need for preventative services including immunizations, lifestyle education, counseling to decrease risk of preventable diseases and screening for fall risk and other medical concerns.    This visit is provided free of charge (no copay) for all Medicare recipients. The clinical pharmacists at [facility] have begun to conduct these Wellness Visits which will also include a thorough review of all your medications.    As you primary medical provider recommend that you make an appointment for your Annual Wellness Visit if you have not done so already this year.  You may set up this appointment before you leave today or you may call back [phone]) and schedule an appointment.  Please make sure when you call that you mention that you are scheduling your Annual Wellness Visit with the clinical pharmacist so that the appointment may be made for the proper length of time.     Continue current medications. Continue good therapeutic lifestyle changes which include good diet and exercise. Fall precautions discussed with patient. If an FOBT was given today- please return it to our front desk. If you are over 67 years old - you may need Prevnar 13 or the adult Pneumonia vaccine.  **Flu shots are available--- please call and schedule a FLU-CLINIC appointment**  After your visit with us today you will receive a survey in the mail or online from [employer] regarding your care with us. Please take a moment to fill this out. Your feedback is very  important to us as you can help us better understand your patient needs as well as improve your experience and satisfaction. WE CARE ABOUT YOU!!!   We will call with lab work results as soon as these results become available Use hand sanitizer's and avoid crowds of people to keep from getting the flu Continue to drink plenty of fluids and stay well-hydrated Stay active physically No more CT scans are necessary

## 2017-12-30 NOTE — Progress Notes (Signed)
Subjective:    Patient ID: Manuel Kidd, male    DOB: 1950/12/11, 67 y.o.   MRN: [id number]  HPI Pt here for follow up and management of chronic medical problems which includes hyperlipidemia. He is taking medication regularly.  The patient comes to the visit today with his wife.  He has no specific complaints and does not need any refills on any of his medicines.  He has refused to get the flu shot.  He had a CT in October of this year.  There is a family history of colon polyps and his mother died with heart failure and his brother of course has heart disease.  Patient is currently taking a baby aspirin daily taking multivitamins omega-3 fatty acids and Crestor 5 mg along with vitamin D3.  The CT scan showed no acute cardiopulmonary disease with several small subcentimeter bilateral pulmonary nodules unchanged for greater than 2 years and therefore considered benign.  There is subtle atherosclerotic coronary artery disease.  The patient recently saw the dermatologist.  The CT scan results were reviewed with the patient he understands that he does not have to have another chest CT.  It did show some coronary artery calcium and I reminded the patient to continue reducing all risk factors related to heart disease and keeping his cholesterol down.  He denies any chest pain pressure tightness or shortness of breath.  He denies any trouble with his stomach including swallowing heartburn indigestion nausea vomiting diarrhea blood in the stool black tarry bowel movements or change in bowel habits.  He is passing his water well without problems.  He is up-to-date on his eye exams.  The dermatologist this past Monday did perform cryotherapy on several precancerous lesions on his ears and face.    Patient Active Problem List   Diagnosis Date Noted  . Abnormal CT scan of lung 10/11/2016  . Aortic atherosclerosis ([place]) 11/16/2015  . Thrombocytopenia ([place]) 06/21/2015  . Solitary pulmonary nodule 06/20/2015  .  Hyperlipidemia 08/16/2012  . BPH (benign prostatic hypertrophy) 08/16/2012  . Vitamin D deficiency   . Difficult airway for intubation    Outpatient Encounter Medications as of 12/30/2017  Medication Sig  . aspirin 81 MG tablet Take 81 mg by mouth daily.  . Multiple Vitamin (MULTIVITAMIN) tablet Take 1 tablet by mouth daily.  . Omega-3 Fatty Acids (FISH OIL) 1000 MG CAPS Take 1 capsule by mouth daily. 5 days a week  . rosuvastatin (CRESTOR) 5 MG tablet Take 0.5 tablets (2.5 mg total) by mouth 2 (two) times a week.  Marland Kitchen VITAMIN D, ERGOCALCIFEROL, PO Take 2,000 Units by mouth daily. 5 days a week   No facility-administered encounter medications on file as of 12/30/2017.      Review of Systems  Constitutional: Negative.   HENT: Negative.   Eyes: Negative.   Respiratory: Negative.   Cardiovascular: Negative.   Gastrointestinal: Negative.   Endocrine: Negative.   Genitourinary: Negative.   Musculoskeletal: Negative.   Skin: Negative.   Allergic/Immunologic: Negative.   Neurological: Negative.   Hematological: Negative.   Psychiatric/Behavioral: Negative.        Objective:   Physical Exam Vitals signs and nursing note reviewed.  Constitutional:      Appearance: Normal appearance. He is well-developed and normal weight.     Comments: The patient is pleasant smiling and feeling well with no specific complaints.  HENT:     Head: Normocephalic and atraumatic.     Right Ear: Tympanic membrane, ear canal and  external ear normal. There is no impacted cerumen.     Left Ear: Tympanic membrane, ear canal and external ear normal. There is no impacted cerumen.     Nose: Nose normal. No congestion or rhinorrhea.     Mouth/Throat:     Mouth: Mucous membranes are moist.     Pharynx: Oropharynx is clear. No oropharyngeal exudate.  Eyes:     General: No scleral icterus.       Right eye: No discharge.        Left eye: No discharge.     Conjunctiva/sclera: Conjunctivae normal.     Pupils:  Pupils are equal, round, and reactive to light.  Neck:     Musculoskeletal: Normal range of motion and neck supple.     Thyroid: No thyromegaly.     Vascular: No carotid bruit.     Trachea: No tracheal deviation.     Comments: No thyromegaly Cardiovascular:     Rate and Rhythm: Normal rate and regular rhythm.     Heart sounds: Normal heart sounds. No murmur.     Comments: Heart is regular at 72/min good pedal pulses and no edema Pulmonary:     Effort: Pulmonary effort is normal. No respiratory distress.     Breath sounds: Normal breath sounds. No wheezing or rales.     Comments: Clear anteriorly and posteriorly no axillary adenopathy and no chest wall masses or chest wall tenderness Chest:     Chest wall: No tenderness.  Abdominal:     General: Abdomen is flat. Bowel sounds are normal.     Palpations: Abdomen is soft. There is no mass.     Tenderness: There is no abdominal tenderness.     Comments: No liver or spleen enlargement.  No masses.  No tenderness.  No bruits.  No inguinal adenopathy.  Musculoskeletal: Normal range of motion.        General: No tenderness.     Right lower leg: No edema.     Left lower leg: No edema.  Lymphadenopathy:     Cervical: No cervical adenopathy.  Skin:    General: Skin is warm and dry.     Findings: No rash.  Neurological:     General: No focal deficit present.     Mental Status: He is alert and oriented to person, place, and time. Mental status is at baseline.     Cranial Nerves: No cranial nerve deficit.     Deep Tendon Reflexes: Reflexes are normal and symmetric.  Psychiatric:        Mood and Affect: Mood normal.        Behavior: Behavior normal.        Thought Content: Thought content normal.        Judgment: Judgment normal.     Comments: Mood affect and behavior are normal and positive for this patient     BP 127/76 (BP Location: Left Arm)   Pulse 62   Temp (!) 96.8 F (36 C) (Oral)   Ht '6\' 2"'$  (1.88 m)   Wt 193 lb (87.5 kg)    BMI 24.78 kg/m        Assessment & Plan:  1. Vitamin D deficiency -Tinea with vitamin D replacement pending results of lab work - CBC with Differential/Platelet - VITAMIN D 25 Hydroxy (Vit-D Deficiency, Fractures)  2. Pure hypercholesterolemia -Continue to make all efforts to reduce cholesterol in the diet and keep weight down through diet and exercise - BMP8+EGFR - CBC with  Differential/Platelet - Lipid panel - Hepatic function panel  3. Family history of heart disease -Continue current treatment and physical activity - BMP8+EGFR - CBC with Differential/Platelet - Hepatic function panel  4. Aortic atherosclerosis (HCC) -Continue cholesterol management - CBC with Differential/Platelet - Lipid panel - Hepatic function panel  5. Thrombocytopenia (HCC) -No complaints of bleeding or easy bruising. - CBC with Differential/Platelet   Patient Instructions                       Medicare Annual Wellness Visit  [place] and the medical providers at [facility] strive to bring you the best medical care.  In doing so we not only want to address your current medical conditions and concerns but also to detect new conditions early and prevent illness, disease and health-related problems.    Medicare offers a yearly Wellness Visit which allows our clinical staff to assess your need for preventative services including immunizations, lifestyle education, counseling to decrease risk of preventable diseases and screening for fall risk and other medical concerns.    This visit is provided free of charge (no copay) for all Medicare recipients. The clinical pharmacists at [facility] have begun to conduct these Wellness Visits which will also include a thorough review of all your medications.    As you primary medical provider recommend that you make an appointment for your Annual Wellness Visit if you have not done so already this year.  You may  set up this appointment before you leave today or you may call back [phone]) and schedule an appointment.  Please make sure when you call that you mention that you are scheduling your Annual Wellness Visit with the clinical pharmacist so that the appointment may be made for the proper length of time.     Continue current medications. Continue good therapeutic lifestyle changes which include good diet and exercise. Fall precautions discussed with patient. If an FOBT was given today- please return it to our front desk. If you are over 72 years old - you may need Prevnar 47 or the adult Pneumonia vaccine.  **Flu shots are available--- please call and schedule a FLU-CLINIC appointment**  After your visit with [place] today you will receive a survey in the mail or online from [employer] regarding your care with [place]. Please take a moment to fill this out. Your feedback is very important to [place] as you can help [place] better understand your patient needs as well as improve your experience and satisfaction. WE CARE ABOUT YOU!!!   We will call with lab work results as soon as these results become available Use hand sanitizer's and avoid crowds of people to keep from getting the flu Continue to drink plenty of fluids and stay well-hydrated Stay active physically No more CT scans are necessary  Arrie Senate MD

## 2017-12-31 LAB — LIPID PANEL
CHOL/HDL RATIO: 2.7 ratio (ref 0.0–5.0)
Cholesterol, Total: 140 mg/dL (ref 100–199)
HDL: 51 mg/dL (ref >39)
LDL CALC: 78 mg/dL (ref 0–99)
Triglycerides: 53 mg/dL (ref 0–149)
VLDL Cholesterol Cal: 11 mg/dL (ref 5–40)

## 2017-12-31 LAB — CBC WITH DIFFERENTIAL/PLATELET
BASOS: 1 %
Basophils Absolute: 0.1 10*3/uL (ref 0.0–0.2)
EOS (ABSOLUTE): 0.3 10*3/uL (ref 0.0–0.4)
Eos: 7 %
HEMATOCRIT: 45.6 % (ref 37.5–51.0)
HEMOGLOBIN: 15.3 g/dL (ref 13.0–17.7)
Immature Grans (Abs): 0 10*3/uL (ref 0.0–0.1)
Immature Granulocytes: 0 %
LYMPHS ABS: 1 10*3/uL (ref 0.7–3.1)
LYMPHS: 23 %
MCH: 28.7 pg (ref 26.6–33.0)
MCHC: 33.6 g/dL (ref 31.5–35.7)
MCV: 86 fL (ref 79–97)
MONOS ABS: 0.3 10*3/uL (ref 0.1–0.9)
Monocytes: 8 %
NEUTROS ABS: 2.6 10*3/uL (ref 1.4–7.0)
NEUTROS PCT: 61 %
Platelets: 157 10*3/uL (ref 150–450)
RBC: 5.33 x10E6/uL (ref 4.14–5.80)
RDW: 12.9 % (ref 12.3–15.4)
WBC: 4.2 10*3/uL (ref 3.4–10.8)

## 2017-12-31 LAB — BMP8+EGFR
BUN/Creatinine Ratio: 12 (ref 10–24)
BUN: 12 mg/dL (ref 8–27)
CALCIUM: 9.5 mg/dL (ref 8.6–10.2)
CHLORIDE: 98 mmol/L (ref 96–106)
CO2: 26 mmol/L (ref 20–29)
Creatinine, Ser: 1 mg/dL (ref 0.76–1.27)
GFR calc Af Amer: 90 mL/min/{1.73_m2} (ref >59)
GFR, EST NON AFRICAN AMERICAN: 78 mL/min/{1.73_m2} (ref >59)
GLUCOSE: 86 mg/dL (ref 65–99)
POTASSIUM: 4.2 mmol/L (ref 3.5–5.2)
Sodium: 142 mmol/L (ref 134–144)

## 2017-12-31 LAB — HEPATIC FUNCTION PANEL
ALBUMIN: 4.5 g/dL (ref 3.6–4.8)
ALT: 18 IU/L (ref 0–44)
AST: 28 IU/L (ref 0–40)
Alkaline Phosphatase: 60 IU/L (ref 39–117)
Bilirubin Total: 0.7 mg/dL (ref 0.0–1.2)
Bilirubin, Direct: 0.22 mg/dL (ref 0.00–0.40)
TOTAL PROTEIN: 7 g/dL (ref 6.0–8.5)

## 2017-12-31 LAB — VITAMIN D 25 HYDROXY (VIT D DEFICIENCY, FRACTURES): VIT D 25 HYDROXY: 52.1 ng/mL (ref 30.0–100.0)

## 2018-01-14 ENCOUNTER — Encounter: Payer: Self-pay | Admitting: Family Medicine

## 2018-01-14 ENCOUNTER — Ambulatory Visit: Payer: Medicare Other | Admitting: Family Medicine

## 2018-01-14 VITALS — BP 129/76 | HR 82 | Temp 98.9°F | Ht 74.0 in | Wt 192.0 lb

## 2018-01-14 DIAGNOSIS — J40 Bronchitis, not specified as acute or chronic: Secondary | ICD-10-CM | POA: Diagnosis not present

## 2018-01-14 DIAGNOSIS — J329 Chronic sinusitis, unspecified: Secondary | ICD-10-CM | POA: Diagnosis not present

## 2018-01-14 MED ORDER — FLUTICASONE PROPIONATE 50 MCG/ACT NA SUSP: 1.0000 | Freq: Two times a day (BID) | NASAL | 6 refills | Status: DC | PRN

## 2018-01-14 MED ORDER — AZITHROMYCIN 250 MG PO TABS: ORAL_TABLET | ORAL | 0 refills | Status: DC

## 2018-01-14 NOTE — Progress Notes (Signed)
BP 129/76   Pulse 82   Temp 98.9 F (37.2 C) (Oral)   Ht 6\' 2"  (1.88 m)   Wt 192 lb (87.1 kg)   SpO2 97%   BMI 24.65 kg/m    Subjective:    Patient ID: Manuel Kidd, male    DOB: 1950/11/13, 68 y.o.   MRN: [id number]  HPI: Manuel CharonJames Gunn is a 68 y.o. male presenting on 01/14/2018 for Chest congestion, cough, fever (x 3 days)   HPI Cough and congestion and fever Patient is coming in with cough and congestion and fever this been going on for the past 3 days.  He is also been having chills.  He has been using Tylenol and Mucinex.  He has been have a cough that is very productive of phlegm and sputum.  He says his fever was as high as 102 earlier today and had the same fever yesterday as well.  It does come down with the Tylenol.  He denies any sick contacts that he knows of.  He has been using his wife's Flonase as well a little bit.  Relevant past medical, surgical, family and social history reviewed and updated as indicated. Interim medical history since our last visit reviewed. Allergies and medications reviewed and updated.  Review of Systems  Constitutional: Positive for chills and fever.  HENT: Positive for congestion, postnasal drip, rhinorrhea, sinus pressure, sneezing and sore throat. Negative for ear discharge, ear pain and voice change.   Eyes: Negative for pain, discharge, redness and visual disturbance.  Respiratory: Positive for cough. Negative for chest tightness, shortness of breath and wheezing.   Cardiovascular: Negative for chest pain and leg swelling.  Gastrointestinal: Negative for abdominal pain, constipation and diarrhea.  Genitourinary: Negative for difficulty urinating.  Musculoskeletal: Positive for myalgias. Negative for back pain and gait problem.  Skin: Negative for rash.  Neurological: Negative for syncope, light-headedness and headaches.  All other systems reviewed and are negative.   Per HPI unless specifically indicated above   Allergies as of  01/14/2018   No Known Allergies     Medication List       Accurate as of January 14, 2018  6:32 PM. Always use your most recent med list.        aspirin 81 MG tablet Take 81 mg by mouth daily.   azithromycin 250 MG tablet Commonly known as:  ZITHROMAX Take 2 the first day and then one each day after.   Fish Oil 1000 MG Caps Take 1 capsule by mouth daily. 5 days a week   multivitamin tablet Take 1 tablet by mouth daily.   rosuvastatin 5 MG tablet Commonly known as:  CRESTOR Take 0.5 tablets (2.5 mg total) by mouth 2 (two) times a week.   VITAMIN D (ERGOCALCIFEROL) PO Take 2,000 Units by mouth daily. 5 days a week          Objective:    BP 129/76   Pulse 82   Temp 98.9 F (37.2 C) (Oral)   Ht 6\' 2"  (1.88 m)   Wt 192 lb (87.1 kg)   SpO2 97%   BMI 24.65 kg/m   Wt Readings from Last 3 Encounters:  01/14/18 192 lb (87.1 kg)  12/30/17 193 lb (87.5 kg)  06/15/17 190 lb (86.2 kg)    Physical Exam Vitals signs and nursing note reviewed.  Constitutional:      General: He is not in acute distress.    Appearance: He is well-developed. He is not diaphoretic.  HENT:     Right Ear: Tympanic membrane, ear canal and external ear normal.     Left Ear: Tympanic membrane, ear canal and external ear normal.     Nose: Mucosal edema and rhinorrhea present.     Right Sinus: Maxillary sinus tenderness present. No frontal sinus tenderness.     Left Sinus: Maxillary sinus tenderness present. No frontal sinus tenderness.     Mouth/Throat:     Pharynx: Uvula midline. Posterior oropharyngeal erythema present. No oropharyngeal exudate.     Tonsils: No tonsillar abscesses.  Eyes:     General: No scleral icterus.       Right eye: No discharge.     Conjunctiva/sclera: Conjunctivae normal.     Pupils: Pupils are equal, round, and reactive to light.  Neck:     Musculoskeletal: Neck supple.     Thyroid: No thyromegaly.  Cardiovascular:     Rate and Rhythm: Normal rate and regular  rhythm.     Heart sounds: Normal heart sounds. No murmur.  Pulmonary:     Effort: Pulmonary effort is normal. No respiratory distress.     Breath sounds: Normal breath sounds. No stridor. No wheezing, rhonchi or rales.  Musculoskeletal: Normal range of motion.  Lymphadenopathy:     Cervical: No cervical adenopathy.  Skin:    General: Skin is warm and dry.     Findings: No rash.  Neurological:     Mental Status: He is alert and oriented to person, place, and time.     Coordination: Coordination normal.  Psychiatric:        Behavior: Behavior normal.     Rapid flu: Negative    Assessment & Plan:   Problem List Items Addressed This Visit    None    Visit Diagnoses    Sinobronchitis    -  Primary   Relevant Medications   azithromycin (ZITHROMAX) 250 MG tablet   fluticasone (FLONASE) 50 MCG/ACT nasal spray   Other Relevant Orders   Veritor Flu A/B Waived       Follow up plan: Return if symptoms worsen or fail to improve.  Counseling provided for all of the vaccine components Orders Placed This Encounter  Procedures  . Veritor Flu A/B Waived    Arville Care, MD [facility] 01/14/2018, 6:32 PM

## 2018-01-15 LAB — VERITOR FLU A/B WAIVED
Influenza A: NEGATIVE
Influenza B: NEGATIVE

## 2018-01-21 ENCOUNTER — Telehealth: Payer: Self-pay | Admitting: Family Medicine

## 2018-01-21 ENCOUNTER — Ambulatory Visit: Payer: Medicare Other | Admitting: Family

## 2018-01-21 DIAGNOSIS — J40 Bronchitis, not specified as acute or chronic: Principal | ICD-10-CM

## 2018-01-21 DIAGNOSIS — J329 Chronic sinusitis, unspecified: Secondary | ICD-10-CM

## 2018-01-21 MED ORDER — AZITHROMYCIN 250 MG PO TABS: ORAL_TABLET | ORAL | 0 refills | Status: DC

## 2018-01-21 NOTE — Telephone Encounter (Signed)
What symptoms do you have? Was seen 01-14-18 with Dettinger Still coughing up yellow suff. Sounds like he has decrease breath sounds on left side and left side of head is stopped up. Patient has appt after hours @ 5:30 today but wanted to leave message for [place].  How long have you been sick? 01-14-2018  Have you been seen for this problem? 01-14-2018  If your provider decides to give you a prescription, which pharmacy would you like for it to be sent to? [facility] in [place]   Patient informed that this information will be sent to the clinical staff for review and that they should receive a follow up call.

## 2018-01-21 NOTE — Telephone Encounter (Signed)
Please call in a Z-Pak for this patient and have him take Mucinex maximum strength plain, 1 twice daily with a large glass of water for cough and congestion.  Give him a follow-up appointment in 10 days so that we can make sure everything is improved.

## 2018-01-21 NOTE — Telephone Encounter (Signed)
FYI

## 2018-01-21 NOTE — Telephone Encounter (Signed)
Dr Christell Constant, he would rather have something called in, instead of coming in tonight.

## 2018-01-21 NOTE — Telephone Encounter (Signed)
Aware per VM

## 2018-06-16 ENCOUNTER — Telehealth: Payer: Self-pay | Admitting: Family Medicine

## 2018-06-21 DIAGNOSIS — M720 Palmar fascial fibromatosis [Dupuytren]: Secondary | ICD-10-CM | POA: Insufficient documentation

## 2018-06-21 DIAGNOSIS — M79642 Pain in left hand: Secondary | ICD-10-CM | POA: Insufficient documentation

## 2018-07-08 ENCOUNTER — Ambulatory Visit: Payer: Medicare Other | Admitting: Family Medicine

## 2018-07-14 ENCOUNTER — Other Ambulatory Visit: Payer: Self-pay

## 2018-07-15 ENCOUNTER — Ambulatory Visit: Payer: Medicare Other

## 2018-07-15 ENCOUNTER — Encounter: Payer: Self-pay | Admitting: Family Medicine

## 2018-07-15 ENCOUNTER — Ambulatory Visit (INDEPENDENT_AMBULATORY_CARE_PROVIDER_SITE_OTHER): Payer: Medicare Other | Admitting: Family Medicine

## 2018-07-15 VITALS — BP 137/82 | HR 62 | Temp 97.5°F | Ht 74.0 in | Wt 190.2 lb

## 2018-07-15 DIAGNOSIS — Z0001 Encounter for general adult medical examination with abnormal findings: Secondary | ICD-10-CM | POA: Diagnosis not present

## 2018-07-15 DIAGNOSIS — Z23 Encounter for immunization: Secondary | ICD-10-CM

## 2018-07-15 DIAGNOSIS — E782 Mixed hyperlipidemia: Secondary | ICD-10-CM

## 2018-07-15 DIAGNOSIS — N400 Enlarged prostate without lower urinary tract symptoms: Secondary | ICD-10-CM

## 2018-07-15 DIAGNOSIS — I700 Atherosclerosis of aorta: Secondary | ICD-10-CM | POA: Diagnosis not present

## 2018-07-15 DIAGNOSIS — E559 Vitamin D deficiency, unspecified: Secondary | ICD-10-CM

## 2018-07-15 DIAGNOSIS — Z0000 Encounter for general adult medical examination without abnormal findings: Secondary | ICD-10-CM

## 2018-07-15 DIAGNOSIS — Z1159 Encounter for screening for other viral diseases: Secondary | ICD-10-CM

## 2018-07-15 DIAGNOSIS — Z13820 Encounter for screening for osteoporosis: Secondary | ICD-10-CM

## 2018-07-15 DIAGNOSIS — D696 Thrombocytopenia, unspecified: Secondary | ICD-10-CM

## 2018-07-15 NOTE — Addendum Note (Signed)
Addended by: Karle Plumber on: 07/15/2018 09:45 AM   Modules accepted: Orders

## 2018-07-15 NOTE — Progress Notes (Signed)
BP 137/82   Pulse 62   Temp (!) 97.5 F (36.4 C) (Oral)   Ht '6\' 2"'$  (1.88 m)   Wt 190 lb 3.2 oz (86.3 kg)   BMI 24.42 kg/m    Subjective:   Patient ID: Manuel Kidd, male    DOB: 04-21-1950, 68 y.o.   MRN: [id number]  HPI: Manuel Kidd is a 68 y.o. male presenting on 07/15/2018 for Establish Care and Annual Exam   HPI Adult well exam physical and recheck of chronic issues. Patient is coming in today for adult well exam and physical and recheck of chronic issues.  Patient has been diagnosed with BPH in the past but does not have any issues currently.  He has been on different statins in the past and has been intolerant of them, most recently was Crestor.  Patient has been diagnosed with vitamin D deficiency and hyperlipidemia and aortic atherosclerosis in the past as well. Patient denies any chest pain, shortness of breath, headaches or vision issues, abdominal complaints, diarrhea, nausea, vomiting, or joint issues.   Relevant past medical, surgical, family and social history reviewed and updated as indicated. Interim medical history since our last visit reviewed. Allergies and medications reviewed and updated.  Review of Systems  Constitutional: Negative for chills and fever.  HENT: Negative for ear pain and tinnitus.   Eyes: Negative for pain.  Respiratory: Negative for cough, shortness of breath and wheezing.   Cardiovascular: Negative for chest pain, palpitations and leg swelling.  Gastrointestinal: Negative for abdominal pain, blood in stool, constipation and diarrhea.  Genitourinary: Negative for dysuria and hematuria.  Musculoskeletal: Negative for back pain and myalgias.  Skin: Negative for rash.  Neurological: Negative for dizziness, weakness and headaches.  Psychiatric/Behavioral: Negative for suicidal ideas.    Per HPI unless specifically indicated above   Allergies as of 07/15/2018   No Known Allergies     Medication List       Accurate as of July 15, 2018   9:30 AM. If you have any questions, ask your nurse or doctor.        STOP taking these medications   azithromycin 250 MG tablet Commonly known as: ZITHROMAX Stopped by: Fransisca Kaufmann Dettinger, MD   fluticasone 50 MCG/ACT nasal spray Commonly known as: FLONASE Stopped by: Worthy Rancher, MD   rosuvastatin 5 MG tablet Commonly known as: Crestor Stopped by: Worthy Rancher, MD     TAKE these medications   aspirin 81 MG tablet Take 81 mg by mouth daily.   Fish Oil 1000 MG Caps Take 1 capsule by mouth daily. 5 days a week   multivitamin tablet Take 1 tablet by mouth daily.   VITAMIN D (ERGOCALCIFEROL) PO Take 2,000 Units by mouth daily. 5 days a week        Objective:   BP 137/82   Pulse 62   Temp (!) 97.5 F (36.4 C) (Oral)   Ht '6\' 2"'$  (1.88 m)   Wt 190 lb 3.2 oz (86.3 kg)   BMI 24.42 kg/m   Wt Readings from Last 3 Encounters:  07/15/18 190 lb 3.2 oz (86.3 kg)  01/14/18 192 lb (87.1 kg)  12/30/17 193 lb (87.5 kg)    Physical Exam Vitals signs and nursing note reviewed.  Constitutional:      General: He is not in acute distress.    Appearance: He is well-developed. He is not diaphoretic.  Eyes:     General: No scleral icterus.    Conjunctiva/sclera:  Conjunctivae normal.  Cardiovascular:     Rate and Rhythm: Normal rate and regular rhythm.     Heart sounds: Normal heart sounds. No murmur.  Pulmonary:     Effort: Pulmonary effort is normal. No respiratory distress.     Breath sounds: Normal breath sounds. No wheezing.  Genitourinary:    Prostate: Enlarged. Not tender and no nodules present.     Rectum: External hemorrhoid present. No tenderness or anal fissure. Normal anal tone.  Musculoskeletal: Normal range of motion.  Skin:    General: Skin is warm and dry.     Findings: No rash.  Neurological:     Mental Status: He is alert and oriented to person, place, and time.     Coordination: Coordination normal.  Psychiatric:        Behavior: Behavior  normal.       Assessment & Plan:   Problem List Items Addressed This Visit      Cardiovascular and Mediastinum   Aortic atherosclerosis ([place])   Relevant Orders   CBC with Differential/Platelet   CMP14+EGFR     Genitourinary   Benign prostatic hyperplasia   Relevant Orders   PSA, total and free     Other   Vitamin D deficiency   Relevant Orders   VITAMIN D 25 Hydroxy (Vit-D Deficiency, Fractures)   Hyperlipidemia   Relevant Orders   Lipid panel   Thrombocytopenia ([place])   Relevant Orders   CBC with Differential/Platelet   CMP14+EGFR    Other Visit Diagnoses    Well adult exam    -  Primary   Osteoporosis screening       Relevant Orders   DG [facility] DEXA      Continue current medication, will check blood work today Follow up plan: Return in about 4 months (around 11/15/2018), or if symptoms worsen or fail to improve, for Recheck cholesterol.  Counseling provided for all of the vaccine components Orders Placed This Encounter  Procedures  . DG [facility] DEXA  . CBC with Differential/Platelet  . CMP14+EGFR  . Lipid panel  . PSA, total and free  . VITAMIN D 25 Hydroxy (Vit-D Deficiency, Fractures)    Caryl Pina, MD [facility] 07/15/2018, 9:30 AM

## 2018-07-16 LAB — CMP14+EGFR
ALT: 13 IU/L (ref 0–44)
AST: 27 IU/L (ref 0–40)
Albumin/Globulin Ratio: 2 (ref 1.2–2.2)
Albumin: 4.6 g/dL (ref 3.8–4.8)
Alkaline Phosphatase: 64 IU/L (ref 39–117)
BUN/Creatinine Ratio: 14 (ref 10–24)
BUN: 13 mg/dL (ref 8–27)
Bilirubin Total: 0.8 mg/dL (ref 0.0–1.2)
CO2: 26 mmol/L (ref 20–29)
Calcium: 9.6 mg/dL (ref 8.6–10.2)
Chloride: 103 mmol/L (ref 96–106)
Creatinine, Ser: 0.95 mg/dL (ref 0.76–1.27)
GFR calc Af Amer: 95 mL/min/{1.73_m2} (ref >59)
GFR calc non Af Amer: 82 mL/min/{1.73_m2} (ref >59)
Globulin, Total: 2.3 g/dL (ref 1.5–4.5)
Glucose: 90 mg/dL (ref 65–99)
Potassium: 4.8 mmol/L (ref 3.5–5.2)
Sodium: 140 mmol/L (ref 134–144)
Total Protein: 6.9 g/dL (ref 6.0–8.5)

## 2018-07-16 LAB — CBC WITH DIFFERENTIAL/PLATELET
Basophils Absolute: 0.1 10*3/uL (ref 0.0–0.2)
Basos: 1 %
EOS (ABSOLUTE): 0.3 10*3/uL (ref 0.0–0.4)
Eos: 7 %
Hematocrit: 46.4 % (ref 37.5–51.0)
Hemoglobin: 15.6 g/dL (ref 13.0–17.7)
Immature Grans (Abs): 0 10*3/uL (ref 0.0–0.1)
Immature Granulocytes: 0 %
Lymphocytes Absolute: 1 10*3/uL (ref 0.7–3.1)
Lymphs: 22 %
MCH: 28.5 pg (ref 26.6–33.0)
MCHC: 33.6 g/dL (ref 31.5–35.7)
MCV: 85 fL (ref 79–97)
Monocytes Absolute: 0.3 10*3/uL (ref 0.1–0.9)
Monocytes: 7 %
Neutrophils Absolute: 2.8 10*3/uL (ref 1.4–7.0)
Neutrophils: 63 %
Platelets: 144 10*3/uL — ABNORMAL LOW (ref 150–450)
RBC: 5.47 x10E6/uL (ref 4.14–5.80)
RDW: 12.6 % (ref 11.6–15.4)
WBC: 4.5 10*3/uL (ref 3.4–10.8)

## 2018-07-16 LAB — PSA, TOTAL AND FREE
PSA, Free Pct: 18.3 %
PSA, Free: 0.95 ng/mL
Prostate Specific Ag, Serum: 5.2 ng/mL — ABNORMAL HIGH (ref 0.0–4.0)

## 2018-07-16 LAB — LIPID PANEL
Chol/HDL Ratio: 3.6 ratio (ref 0.0–5.0)
Cholesterol, Total: 183 mg/dL (ref 100–199)
HDL: 51 mg/dL (ref >39)
LDL Calculated: 118 mg/dL — ABNORMAL HIGH (ref 0–99)
Triglycerides: 69 mg/dL (ref 0–149)
VLDL Cholesterol Cal: 14 mg/dL (ref 5–40)

## 2018-07-16 LAB — VITAMIN D 25 HYDROXY (VIT D DEFICIENCY, FRACTURES): Vit D, 25-Hydroxy: 48.1 ng/mL (ref 30.0–100.0)

## 2018-07-16 LAB — HEPATITIS C ANTIBODY: Hep C Virus Ab: <0.1 s/co ratio (ref 0.0–0.9)

## 2018-11-12 ENCOUNTER — Other Ambulatory Visit: Payer: Self-pay

## 2018-11-15 ENCOUNTER — Encounter: Payer: Self-pay | Admitting: Family Medicine

## 2018-11-15 ENCOUNTER — Other Ambulatory Visit: Payer: Self-pay

## 2018-11-15 ENCOUNTER — Ambulatory Visit: Payer: Medicare Other | Admitting: Family Medicine

## 2018-11-15 VITALS — BP 132/77 | HR 59 | Temp 96.2°F | Ht 74.0 in | Wt 193.6 lb

## 2018-11-15 DIAGNOSIS — Z8619 Personal history of other infectious and parasitic diseases: Secondary | ICD-10-CM | POA: Diagnosis not present

## 2018-11-15 DIAGNOSIS — N400 Enlarged prostate without lower urinary tract symptoms: Secondary | ICD-10-CM

## 2018-11-15 DIAGNOSIS — E782 Mixed hyperlipidemia: Secondary | ICD-10-CM | POA: Diagnosis not present

## 2018-11-15 MED ORDER — VALACYCLOVIR HCL 1 G PO TABS: 1000.0000 mg | ORAL_TABLET | Freq: Two times a day (BID) | ORAL | 0 refills | Status: DC

## 2018-11-15 NOTE — Progress Notes (Signed)
BP 132/77   Pulse (!) 59   Temp (!) 96.2 F (35.7 C) (Temporal)   Ht 6\' 2"  (1.88 m)   Wt 193 lb 9.6 oz (87.8 kg)   SpO2 98%   BMI 24.86 kg/m    Subjective:   Patient ID: Manuel Kidd, male    DOB: 08/22/1950, 68 y.o.   MRN: [id number]  HPI: Manuel Kidd is a 68 y.o. male presenting on 11/15/2018 for Medical Management of Chronic Issues (4 month follow up)   HPI Hyperlipidemia Patient is coming in for recheck of his hyperlipidemia. The patient is currently taking fish oils. They deny any issues with myalgias or history of liver damage from it. They deny any focal numbness or weakness or chest pain.   Elevated psa And has a history of BPH with elevated PSA and is coming in for recheck today.  He denies any major urinary issues and feels like things have been going okay on that regards.  Has a history of cold sores and keeps Valtrex on hand for 1 or 1 flares up  Relevant past medical, surgical, family and social history reviewed and updated as indicated. Interim medical history since our last visit reviewed. Allergies and medications reviewed and updated.  Review of Systems  Constitutional: Negative for chills and fever.  Eyes: Negative for visual disturbance.  Respiratory: Negative for shortness of breath and wheezing.   Cardiovascular: Negative for chest pain and leg swelling.  Musculoskeletal: Negative for back pain and gait problem.  Skin: Negative for rash.  Neurological: Negative for dizziness, weakness and light-headedness.  All other systems reviewed and are negative.   Per HPI unless specifically indicated above   Allergies as of 11/15/2018   No Known Allergies     Medication List       Accurate as of November 15, 2018  8:06 AM. If you have any questions, ask your nurse or doctor.        aspirin 81 MG tablet Take 81 mg by mouth daily.   Fish Oil 1000 MG Caps Take 1 capsule by mouth daily. 5 days a week   multivitamin tablet Take 1 tablet by mouth  daily.   VITAMIN D (ERGOCALCIFEROL) PO Take 2,000 Units by mouth daily. 5 days a week        Objective:   BP 132/77   Pulse (!) 59   Temp (!) 96.2 F (35.7 C) (Temporal)   Ht 6\' 2"  (1.88 m)   Wt 193 lb 9.6 oz (87.8 kg)   SpO2 98%   BMI 24.86 kg/m   Wt Readings from Last 3 Encounters:  11/15/18 193 lb 9.6 oz (87.8 kg)  07/15/18 190 lb 3.2 oz (86.3 kg)  01/14/18 192 lb (87.1 kg)    Physical Exam Vitals signs and nursing note reviewed.  Constitutional:      General: He is not in acute distress.    Appearance: He is well-developed. He is not diaphoretic.  Eyes:     General: No scleral icterus.    Conjunctiva/sclera: Conjunctivae normal.  Neck:     Musculoskeletal: Neck supple.     Thyroid: No thyromegaly.  Cardiovascular:     Rate and Rhythm: Normal rate and regular rhythm.     Heart sounds: Normal heart sounds. No murmur.  Pulmonary:     Effort: Pulmonary effort is normal. No respiratory distress.     Breath sounds: Normal breath sounds. No wheezing.  Musculoskeletal: Normal range of motion.  Lymphadenopathy:  Cervical: No cervical adenopathy.  Skin:    General: Skin is warm and dry.     Findings: No rash.  Neurological:     Mental Status: He is alert and oriented to person, place, and time.     Coordination: Coordination normal.  Psychiatric:        Behavior: Behavior normal.       Assessment & Plan:   Problem List Items Addressed This Visit      Genitourinary   Benign prostatic hyperplasia   Relevant Orders   PSA, total and free (Completed)     Other   Hyperlipidemia - Primary    Other Visit Diagnoses    History of cold sores        Continue fish oils and will recheck blood work  Gave refill for Valtrex which she can hold onto for when he has another flare.  Follow up plan: Return in about 4 months (around 03/15/2019), or if symptoms worsen or fail to improve, for physical and prostate.  Counseling provided for all of the vaccine  components No orders of the defined types were placed in this encounter.   Arville Care, MD [facility] 11/15/2018, 8:06 AM

## 2018-11-16 LAB — PSA, TOTAL AND FREE
PSA, Free Pct: 17.9 %
PSA, Free: 0.84 ng/mL
Prostate Specific Ag, Serum: 4.7 ng/mL — ABNORMAL HIGH (ref 0.0–4.0)

## 2019-04-14 DIAGNOSIS — M720 Palmar fascial fibromatosis [Dupuytren]: Secondary | ICD-10-CM | POA: Diagnosis not present

## 2019-04-14 DIAGNOSIS — M13849 Other specified arthritis, unspecified hand: Secondary | ICD-10-CM | POA: Diagnosis not present

## 2019-05-18 ENCOUNTER — Other Ambulatory Visit: Payer: Medicare Other

## 2019-05-18 ENCOUNTER — Other Ambulatory Visit: Payer: Self-pay

## 2019-05-18 ENCOUNTER — Ambulatory Visit (INDEPENDENT_AMBULATORY_CARE_PROVIDER_SITE_OTHER): Payer: Medicare PPO | Admitting: Family Medicine

## 2019-05-18 ENCOUNTER — Encounter: Payer: Self-pay | Admitting: Family Medicine

## 2019-05-18 VITALS — BP 125/67 | HR 62 | Temp 97.4°F | Ht 74.0 in | Wt 193.4 lb

## 2019-05-18 DIAGNOSIS — R911 Solitary pulmonary nodule: Secondary | ICD-10-CM | POA: Diagnosis not present

## 2019-05-18 DIAGNOSIS — Z0001 Encounter for general adult medical examination with abnormal findings: Secondary | ICD-10-CM | POA: Diagnosis not present

## 2019-05-18 DIAGNOSIS — E782 Mixed hyperlipidemia: Secondary | ICD-10-CM | POA: Diagnosis not present

## 2019-05-18 DIAGNOSIS — N400 Enlarged prostate without lower urinary tract symptoms: Secondary | ICD-10-CM

## 2019-05-18 DIAGNOSIS — I700 Atherosclerosis of aorta: Secondary | ICD-10-CM | POA: Diagnosis not present

## 2019-05-18 NOTE — Progress Notes (Signed)
BP 125/67   Pulse 62   Temp (!) 97.4 F (36.3 C) (Temporal)   Ht '6\' 2"'$  (1.88 m)   Wt 193 lb 6 oz (87.7 kg)   BMI 24.83 kg/m    Subjective:   Patient ID: Manuel Kidd, male    DOB: April 16, 1950, 69 y.o.   MRN: [id number]  HPI: Manuel Kidd is a 69 y.o. male presenting on 05/18/2019 for Annual Exam   HPI Adult well exam Patient is coming in for adult well exam and physical and recheck chronic issues today.  He denies any major health issues, he does want a follow-up on his CT scan for his lung nodule from a couple years ago and will do that later in the year.  He denies any urinary or prostate issues. Patient denies any chest pain, shortness of breath, headaches or vision issues, abdominal complaints, diarrhea, nausea, vomiting, or joint issues.   Relevant past medical, surgical, family and social history reviewed and updated as indicated. Interim medical history since our last visit reviewed. Allergies and medications reviewed and updated.  Review of Systems  Constitutional: Negative for chills and fever.  HENT: Negative for ear pain and tinnitus.   Eyes: Negative for pain and discharge.  Respiratory: Negative for cough, shortness of breath and wheezing.   Cardiovascular: Negative for chest pain, palpitations and leg swelling.  Gastrointestinal: Negative for abdominal pain, blood in stool, constipation and diarrhea.  Genitourinary: Negative for dysuria and hematuria.  Musculoskeletal: Negative for back pain, gait problem and myalgias.  Skin: Negative for rash.  Neurological: Negative for dizziness, weakness and headaches.  Psychiatric/Behavioral: Negative for suicidal ideas.  All other systems reviewed and are negative.   Per HPI unless specifically indicated above   Allergies as of 05/18/2019   No Known Allergies     Medication List       Accurate as of May 18, 2019 10:59 AM. If you have any questions, ask your nurse or doctor.        aspirin 81 MG tablet Take 81 mg  by mouth daily.   Fish Oil 1000 MG Caps Take 1 capsule by mouth daily. 5 days a week   multivitamin tablet Take 1 tablet by mouth daily.   valACYclovir 1000 MG tablet Commonly known as: VALTREX Take 1 tablet (1,000 mg total) by mouth 2 (two) times daily.   VITAMIN D (ERGOCALCIFEROL) PO Take 2,000 Units by mouth daily. 5 days a week        Objective:   BP 125/67   Pulse 62   Temp (!) 97.4 F (36.3 C) (Temporal)   Ht '6\' 2"'$  (1.88 m)   Wt 193 lb 6 oz (87.7 kg)   BMI 24.83 kg/m   Wt Readings from Last 3 Encounters:  05/18/19 193 lb 6 oz (87.7 kg)  11/15/18 193 lb 9.6 oz (87.8 kg)  07/15/18 190 lb 3.2 oz (86.3 kg)    Physical Exam Vitals and nursing note reviewed.  Constitutional:      General: He is not in acute distress.    Appearance: He is well-developed. He is not diaphoretic.  HENT:     Right Ear: External ear normal.     Left Ear: External ear normal.     Nose: Nose normal.     Mouth/Throat:     Pharynx: No oropharyngeal exudate.  Eyes:     General: No scleral icterus.       Right eye: No discharge.     Conjunctiva/sclera: Conjunctivae  normal.     Pupils: Pupils are equal, round, and reactive to light.  Neck:     Thyroid: No thyromegaly.  Cardiovascular:     Rate and Rhythm: Normal rate and regular rhythm.     Heart sounds: Normal heart sounds. No murmur.  Pulmonary:     Effort: Pulmonary effort is normal. No respiratory distress.     Breath sounds: Normal breath sounds. No wheezing.  Abdominal:     General: Bowel sounds are normal. There is no distension.     Palpations: Abdomen is soft.     Tenderness: There is no abdominal tenderness. There is no guarding or rebound.  Genitourinary:    Prostate: Enlarged. Not tender and no nodules present.     Rectum: External hemorrhoid present. No tenderness or internal hemorrhoid.  Musculoskeletal:        General: Normal range of motion.     Cervical back: Neck supple.  Lymphadenopathy:     Cervical: No  cervical adenopathy.  Skin:    General: Skin is warm and dry.     Findings: No rash.  Neurological:     Mental Status: He is alert and oriented to person, place, and time.     Coordination: Coordination normal.  Psychiatric:        Behavior: Behavior normal.       Assessment & Plan:   Problem List Items Addressed This Visit      Cardiovascular and Mediastinum   Aortic atherosclerosis ([place])   Relevant Orders   CBC with Differential/Platelet   CMP14+EGFR     Genitourinary   Benign prostatic hyperplasia   Relevant Orders   CBC with Differential/Platelet   PSA, total and free     Other   Hyperlipidemia - Primary   Relevant Orders   CBC with Differential/Platelet   Lipid panel   Solitary pulmonary nodule   Relevant Orders   CT Chest W Contrast      Patient is due for follow-up CT later this year.  Patient is otherwise doing very healthy and trying to stay active and denies any further issues. Follow up plan: Return in about 6 months (around 11/18/2019), or if symptoms worsen or fail to improve, for Recheck BPH and cholesterol.  Counseling provided for all of the vaccine components Orders Placed This Encounter  Procedures  . CT Chest W Contrast  . CBC with Differential/Platelet  . CMP14+EGFR  . Lipid panel  . PSA, total and free    Caryl Pina, MD [facility] 05/18/2019, 10:59 AM

## 2019-05-19 LAB — CMP14+EGFR
ALT: 13 IU/L (ref 0–44)
AST: 25 IU/L (ref 0–40)
Albumin/Globulin Ratio: 1.8 (ref 1.2–2.2)
Albumin: 4.4 g/dL (ref 3.8–4.8)
Alkaline Phosphatase: 66 IU/L (ref 39–117)
BUN/Creatinine Ratio: 18 (ref 10–24)
BUN: 18 mg/dL (ref 8–27)
Bilirubin Total: 0.6 mg/dL (ref 0.0–1.2)
CO2: 26 mmol/L (ref 20–29)
Calcium: 9.7 mg/dL (ref 8.6–10.2)
Chloride: 102 mmol/L (ref 96–106)
Creatinine, Ser: 0.98 mg/dL (ref 0.76–1.27)
GFR calc Af Amer: 91 mL/min/{1.73_m2} (ref >59)
GFR calc non Af Amer: 79 mL/min/{1.73_m2} (ref >59)
Globulin, Total: 2.4 g/dL (ref 1.5–4.5)
Glucose: 83 mg/dL (ref 65–99)
Potassium: 4.4 mmol/L (ref 3.5–5.2)
Sodium: 140 mmol/L (ref 134–144)
Total Protein: 6.8 g/dL (ref 6.0–8.5)

## 2019-05-19 LAB — CBC WITH DIFFERENTIAL/PLATELET
Basophils Absolute: 0.1 10*3/uL (ref 0.0–0.2)
Basos: 2 %
EOS (ABSOLUTE): 0.3 10*3/uL (ref 0.0–0.4)
Eos: 6 %
Hematocrit: 47 % (ref 37.5–51.0)
Hemoglobin: 16.1 g/dL (ref 13.0–17.7)
Immature Grans (Abs): 0 10*3/uL (ref 0.0–0.1)
Immature Granulocytes: 0 %
Lymphocytes Absolute: 1.2 10*3/uL (ref 0.7–3.1)
Lymphs: 26 %
MCH: 29 pg (ref 26.6–33.0)
MCHC: 34.3 g/dL (ref 31.5–35.7)
MCV: 85 fL (ref 79–97)
Monocytes Absolute: 0.4 10*3/uL (ref 0.1–0.9)
Monocytes: 8 %
Neutrophils Absolute: 2.8 10*3/uL (ref 1.4–7.0)
Neutrophils: 58 %
Platelets: 169 10*3/uL (ref 150–450)
RBC: 5.55 x10E6/uL (ref 4.14–5.80)
RDW: 13.1 % (ref 11.6–15.4)
WBC: 4.8 10*3/uL (ref 3.4–10.8)

## 2019-05-19 LAB — LIPID PANEL
Chol/HDL Ratio: 3.6 ratio (ref 0.0–5.0)
Cholesterol, Total: 178 mg/dL (ref 100–199)
HDL: 50 mg/dL (ref >39)
LDL Chol Calc (NIH): 118 mg/dL — ABNORMAL HIGH (ref 0–99)
Triglycerides: 52 mg/dL (ref 0–149)
VLDL Cholesterol Cal: 10 mg/dL (ref 5–40)

## 2019-05-19 LAB — PSA, TOTAL AND FREE
PSA, Free Pct: 22.2 %
PSA, Free: 1.11 ng/mL
Prostate Specific Ag, Serum: 5 ng/mL — ABNORMAL HIGH (ref 0.0–4.0)

## 2019-10-05 ENCOUNTER — Encounter: Payer: Self-pay | Admitting: Family Medicine

## 2019-10-14 ENCOUNTER — Other Ambulatory Visit: Payer: Medicare Other

## 2019-10-18 DIAGNOSIS — L814 Other melanin hyperpigmentation: Secondary | ICD-10-CM | POA: Diagnosis not present

## 2019-10-18 DIAGNOSIS — L570 Actinic keratosis: Secondary | ICD-10-CM | POA: Diagnosis not present

## 2019-10-18 DIAGNOSIS — L821 Other seborrheic keratosis: Secondary | ICD-10-CM | POA: Diagnosis not present

## 2019-10-18 DIAGNOSIS — D1801 Hemangioma of skin and subcutaneous tissue: Secondary | ICD-10-CM | POA: Diagnosis not present

## 2019-10-18 DIAGNOSIS — L578 Other skin changes due to chronic exposure to nonionizing radiation: Secondary | ICD-10-CM | POA: Diagnosis not present

## 2019-10-25 ENCOUNTER — Ambulatory Visit
Admission: RE | Admit: 2019-10-25 | Discharge: 2019-10-25 | Disposition: A | Discharge status: Home / Self Care | Payer: Medicare PPO | Source: Ambulatory Visit | Attending: Family Medicine | Admitting: Family Medicine

## 2019-10-25 ENCOUNTER — Other Ambulatory Visit: Payer: Self-pay

## 2019-10-25 DIAGNOSIS — R911 Solitary pulmonary nodule: Secondary | ICD-10-CM

## 2019-10-25 DIAGNOSIS — R918 Other nonspecific abnormal finding of lung field: Secondary | ICD-10-CM | POA: Diagnosis not present

## 2019-10-25 MED ORDER — IOPAMIDOL (ISOVUE-300) INJECTION 61%
75.0000 mL | Freq: Once | INTRAVENOUS | Status: AC | PRN
  Administered 2019-10-25: 75 mL via INTRAVENOUS

## 2019-11-10 ENCOUNTER — Encounter: Payer: Self-pay | Admitting: Family Medicine

## 2019-11-10 DIAGNOSIS — E782 Mixed hyperlipidemia: Secondary | ICD-10-CM

## 2019-11-11 ENCOUNTER — Other Ambulatory Visit: Payer: Self-pay

## 2019-11-11 ENCOUNTER — Other Ambulatory Visit: Payer: Medicare PPO

## 2019-11-11 DIAGNOSIS — E782 Mixed hyperlipidemia: Secondary | ICD-10-CM

## 2019-11-12 LAB — CMP14+EGFR
ALT: 12 IU/L (ref 0–44)
AST: 24 IU/L (ref 0–40)
Albumin/Globulin Ratio: 1.8 (ref 1.2–2.2)
Albumin: 4.4 g/dL (ref 3.8–4.8)
Alkaline Phosphatase: 67 IU/L (ref 44–121)
BUN/Creatinine Ratio: 13 (ref 10–24)
BUN: 11 mg/dL (ref 8–27)
Bilirubin Total: 0.7 mg/dL (ref 0.0–1.2)
CO2: 24 mmol/L (ref 20–29)
Calcium: 9.5 mg/dL (ref 8.6–10.2)
Chloride: 104 mmol/L (ref 96–106)
Creatinine, Ser: 0.86 mg/dL (ref 0.76–1.27)
GFR calc Af Amer: 102 mL/min/{1.73_m2} (ref >59)
GFR calc non Af Amer: 88 mL/min/{1.73_m2} (ref >59)
Globulin, Total: 2.4 g/dL (ref 1.5–4.5)
Glucose: 94 mg/dL (ref 65–99)
Potassium: 4.3 mmol/L (ref 3.5–5.2)
Sodium: 142 mmol/L (ref 134–144)
Total Protein: 6.8 g/dL (ref 6.0–8.5)

## 2019-11-12 LAB — LIPID PANEL
Chol/HDL Ratio: 4.3 ratio (ref 0.0–5.0)
Cholesterol, Total: 189 mg/dL (ref 100–199)
HDL: 44 mg/dL (ref >39)
LDL Chol Calc (NIH): 132 mg/dL — ABNORMAL HIGH (ref 0–99)
Triglycerides: 70 mg/dL (ref 0–149)
VLDL Cholesterol Cal: 13 mg/dL (ref 5–40)

## 2019-11-18 ENCOUNTER — Ambulatory Visit (INDEPENDENT_AMBULATORY_CARE_PROVIDER_SITE_OTHER): Payer: Medicare PPO | Admitting: Family Medicine

## 2019-11-18 ENCOUNTER — Encounter: Payer: Self-pay | Admitting: Family Medicine

## 2019-11-18 ENCOUNTER — Other Ambulatory Visit: Payer: Self-pay

## 2019-11-18 VITALS — BP 133/71 | HR 80 | Temp 98.0°F | Ht 74.0 in | Wt 193.0 lb

## 2019-11-18 DIAGNOSIS — I700 Atherosclerosis of aorta: Secondary | ICD-10-CM | POA: Diagnosis not present

## 2019-11-18 DIAGNOSIS — E782 Mixed hyperlipidemia: Secondary | ICD-10-CM | POA: Diagnosis not present

## 2019-11-18 DIAGNOSIS — N400 Enlarged prostate without lower urinary tract symptoms: Secondary | ICD-10-CM

## 2019-11-18 MED ORDER — PRAVASTATIN SODIUM 10 MG PO TABS: 10.0000 mg | ORAL_TABLET | ORAL | 3 refills | Status: DC

## 2019-11-18 NOTE — Progress Notes (Signed)
BP 133/71   Pulse 80   Temp 98 F (36.7 C)   Ht _0  (1.88 m)   Wt 193 lb (87.5 kg)   SpO2 98%   BMI 24.78 kg/m    Subjective:   Patient ID: Manuel Kidd, male    DOB: March 31, 1950, 69 y.o.   MRN: [id number]  HPI: Manuel Kidd is a 69 y.o. male presenting on 11/18/2019 for Medical Management of Chronic Issues and Hyperlipidemia   HPI BPH Patient is coming in for recheck on BPH Symptoms: None currently Medication: None Last PSA: 5.06 months ago  Hyperlipidemia and aortic atherosclerosis Patient is coming in for recheck of his hyperlipidemia. The patient is currently taking no medication but will try pravastatin. They deny any issues with myalgias or history of liver damage from it. They deny any focal numbness or weakness or chest pain.   Relevant past medical, surgical, family and social history reviewed and updated as indicated. Interim medical history since our last visit reviewed. Allergies and medications reviewed and updated.  Review of Systems  Constitutional: Negative for chills and fever.  Eyes: Negative for visual disturbance.  Respiratory: Negative for shortness of breath and wheezing.   Cardiovascular: Negative for chest pain and leg swelling.  Musculoskeletal: Negative for back pain and gait problem.  Skin: Negative for rash.  Neurological: Negative for dizziness, weakness and light-headedness.  All other systems reviewed and are negative.   Per HPI unless specifically indicated above   Allergies as of 11/18/2019   No Known Allergies     Medication List       Accurate as of November 18, 2019  8:25 AM. If you have any questions, ask your nurse or doctor.        aspirin 81 MG tablet Take 81 mg by mouth daily.   Fish Oil 1000 MG Caps Take 1 capsule by mouth daily. 5 days a week   multivitamin tablet Take 1 tablet by mouth daily.   pravastatin 10 MG tablet Commonly known as: PRAVACHOL Take 1 tablet (10 mg total) by mouth every other  day. Started by: Worthy Rancher, MD   valACYclovir 1000 MG tablet Commonly known as: VALTREX Take 1 tablet (1,000 mg total) by mouth 2 (two) times daily.   VITAMIN D (ERGOCALCIFEROL) PO Take 2,000 Units by mouth daily. 5 days a week        Objective:   BP 133/71   Pulse 80   Temp 98 F (36.7 C)   Ht _1  (1.88 m)   Wt 193 lb (87.5 kg)   SpO2 98%   BMI 24.78 kg/m   Wt Readings from Last 3 Encounters:  11/18/19 193 lb (87.5 kg)  05/18/19 193 lb 6 oz (87.7 kg)  11/15/18 193 lb 9.6 oz (87.8 kg)    Physical Exam Vitals and nursing note reviewed.  Constitutional:      General: He is not in acute distress.    Appearance: He is well-developed. He is not diaphoretic.  Eyes:     General: No scleral icterus.    Conjunctiva/sclera: Conjunctivae normal.  Neck:     Thyroid: No thyromegaly.  Cardiovascular:     Rate and Rhythm: Normal rate and regular rhythm.     Heart sounds: Normal heart sounds. No murmur heard.   Pulmonary:     Effort: Pulmonary effort is normal. No respiratory distress.     Breath sounds: Normal breath sounds. No wheezing.  Musculoskeletal:  General: Normal range of motion.     Cervical back: Neck supple.  Lymphadenopathy:     Cervical: No cervical adenopathy.  Skin:    General: Skin is warm and dry.     Findings: No rash.  Neurological:     Mental Status: He is alert and oriented to person, place, and time.     Coordination: Coordination normal.  Psychiatric:        Behavior: Behavior normal.     Results for orders placed or performed in visit on 11/11/19  Lipid panel  Result Value Ref Range   Cholesterol, Total 189 100 - 199 mg/dL   Triglycerides 70 0 - 149 mg/dL   HDL 44 >39 mg/dL   VLDL Cholesterol Cal 13 5 - 40 mg/dL   LDL Chol Calc (NIH) 132 (H) 0 - 99 mg/dL   Chol/HDL Ratio 4.3 0.0 - 5.0 ratio  CMP14+EGFR  Result Value Ref Range   Glucose 94 65 - 99 mg/dL   BUN 11 8 - 27 mg/dL   Creatinine, Ser 0.86 0.76 - 1.27 mg/dL    GFR calc non Af Amer 88 >59 mL/min/1.73   GFR calc Af Amer 102 >59 mL/min/1.73   BUN/Creatinine Ratio 13 10 - 24   Sodium 142 134 - 144 mmol/L   Potassium 4.3 3.5 - 5.2 mmol/L   Chloride 104 96 - 106 mmol/L   CO2 24 20 - 29 mmol/L   Calcium 9.5 8.6 - 10.2 mg/dL   Total Protein 6.8 6.0 - 8.5 g/dL   Albumin 4.4 3.8 - 4.8 g/dL   Globulin, Total 2.4 1.5 - 4.5 g/dL   Albumin/Globulin Ratio 1.8 1.2 - 2.2   Bilirubin Total 0.7 0.0 - 1.2 mg/dL   Alkaline Phosphatase 67 44 - 121 IU/L   AST 24 0 - 40 IU/L   ALT 12 0 - 44 IU/L    Assessment & Plan:   Problem List Items Addressed This Visit      Cardiovascular and Mediastinum   Aortic atherosclerosis (HCC)   Relevant Medications   pravastatin (PRAVACHOL) 10 MG tablet   Other Relevant Orders   CBC with Differential/Platelet   Lipid panel     Genitourinary   Benign prostatic hyperplasia   Relevant Orders   PSA, total and free     Other   Hyperlipidemia - Primary   Relevant Medications   pravastatin (PRAVACHOL) 10 MG tablet   Other Relevant Orders   CMP14+EGFR   Lipid panel      Will try and start pravastatin 10 mg every day to see how he does with that to see if it helps lower cholesterol numbers. Follow up plan: Return in about 6 months (around 05/17/2020), or if symptoms worsen or fail to improve, for Physical exam.  Counseling provided for all of the vaccine components Orders Placed This Encounter  Procedures  . CBC with Differential/Platelet  . CMP14+EGFR  . Lipid panel  . PSA, total and free    Caryl Pina, MD [facility] 11/18/2019, 8:25 AM

## 2019-12-19 DIAGNOSIS — L570 Actinic keratosis: Secondary | ICD-10-CM | POA: Diagnosis not present

## 2019-12-20 DIAGNOSIS — H04123 Dry eye syndrome of bilateral lacrimal glands: Secondary | ICD-10-CM | POA: Diagnosis not present

## 2020-01-23 DIAGNOSIS — H04123 Dry eye syndrome of bilateral lacrimal glands: Secondary | ICD-10-CM | POA: Diagnosis not present

## 2020-05-11 ENCOUNTER — Other Ambulatory Visit: Payer: Self-pay

## 2020-05-11 ENCOUNTER — Other Ambulatory Visit: Payer: Medicare PPO

## 2020-05-11 DIAGNOSIS — N400 Enlarged prostate without lower urinary tract symptoms: Secondary | ICD-10-CM

## 2020-05-11 DIAGNOSIS — E782 Mixed hyperlipidemia: Secondary | ICD-10-CM

## 2020-05-11 DIAGNOSIS — I700 Atherosclerosis of aorta: Secondary | ICD-10-CM

## 2020-05-12 LAB — CBC WITH DIFFERENTIAL/PLATELET
Basophils Absolute: 0.1 10*3/uL (ref 0.0–0.2)
Basos: 1 %
EOS (ABSOLUTE): 0.2 10*3/uL (ref 0.0–0.4)
Eos: 6 %
Hematocrit: 48.3 % (ref 37.5–51.0)
Hemoglobin: 16 g/dL (ref 13.0–17.7)
Immature Grans (Abs): 0 10*3/uL (ref 0.0–0.1)
Immature Granulocytes: 0 %
Lymphocytes Absolute: 0.9 10*3/uL (ref 0.7–3.1)
Lymphs: 22 %
MCH: 28.2 pg (ref 26.6–33.0)
MCHC: 33.1 g/dL (ref 31.5–35.7)
MCV: 85 fL (ref 79–97)
Monocytes Absolute: 0.4 10*3/uL (ref 0.1–0.9)
Monocytes: 8 %
Neutrophils Absolute: 2.7 10*3/uL (ref 1.4–7.0)
Neutrophils: 63 %
Platelets: 175 10*3/uL (ref 150–450)
RBC: 5.68 x10E6/uL (ref 4.14–5.80)
RDW: 12.3 % (ref 11.6–15.4)
WBC: 4.3 10*3/uL (ref 3.4–10.8)

## 2020-05-12 LAB — PSA, TOTAL AND FREE
PSA, Free Pct: 20.6 %
PSA, Free: 0.97 ng/mL
Prostate Specific Ag, Serum: 4.7 ng/mL — ABNORMAL HIGH (ref 0.0–4.0)

## 2020-05-12 LAB — LIPID PANEL
Chol/HDL Ratio: 3.6 ratio (ref 0.0–5.0)
Cholesterol, Total: 175 mg/dL (ref 100–199)
HDL: 48 mg/dL (ref >39)
LDL Chol Calc (NIH): 116 mg/dL — ABNORMAL HIGH (ref 0–99)
Triglycerides: 55 mg/dL (ref 0–149)
VLDL Cholesterol Cal: 11 mg/dL (ref 5–40)

## 2020-05-12 LAB — CMP14+EGFR
ALT: 14 IU/L (ref 0–44)
AST: 24 IU/L (ref 0–40)
Albumin/Globulin Ratio: 2.3 — ABNORMAL HIGH (ref 1.2–2.2)
Albumin: 4.6 g/dL (ref 3.8–4.8)
Alkaline Phosphatase: 62 IU/L (ref 44–121)
BUN/Creatinine Ratio: 16 (ref 10–24)
BUN: 16 mg/dL (ref 8–27)
Bilirubin Total: 0.6 mg/dL (ref 0.0–1.2)
CO2: 25 mmol/L (ref 20–29)
Calcium: 9.7 mg/dL (ref 8.6–10.2)
Chloride: 105 mmol/L (ref 96–106)
Creatinine, Ser: 1 mg/dL (ref 0.76–1.27)
Globulin, Total: 2 g/dL (ref 1.5–4.5)
Glucose: 96 mg/dL (ref 65–99)
Potassium: 4.9 mmol/L (ref 3.5–5.2)
Sodium: 140 mmol/L (ref 134–144)
Total Protein: 6.6 g/dL (ref 6.0–8.5)
eGFR: 81 mL/min/{1.73_m2} (ref >59)

## 2020-05-18 ENCOUNTER — Other Ambulatory Visit: Payer: Self-pay

## 2020-05-18 ENCOUNTER — Ambulatory Visit (INDEPENDENT_AMBULATORY_CARE_PROVIDER_SITE_OTHER): Payer: Medicare PPO | Admitting: Family Medicine

## 2020-05-18 ENCOUNTER — Encounter: Payer: Self-pay | Admitting: Family Medicine

## 2020-05-18 VITALS — BP 144/81 | HR 69 | Ht 74.0 in | Wt 189.0 lb

## 2020-05-18 DIAGNOSIS — E782 Mixed hyperlipidemia: Secondary | ICD-10-CM

## 2020-05-18 DIAGNOSIS — Z0001 Encounter for general adult medical examination with abnormal findings: Secondary | ICD-10-CM | POA: Diagnosis not present

## 2020-05-18 DIAGNOSIS — N529 Male erectile dysfunction, unspecified: Secondary | ICD-10-CM

## 2020-05-18 DIAGNOSIS — I10 Essential (primary) hypertension: Secondary | ICD-10-CM | POA: Diagnosis not present

## 2020-05-18 DIAGNOSIS — N400 Enlarged prostate without lower urinary tract symptoms: Secondary | ICD-10-CM

## 2020-05-18 DIAGNOSIS — Z0000 Encounter for general adult medical examination without abnormal findings: Secondary | ICD-10-CM

## 2020-05-18 MED ORDER — SILDENAFIL CITRATE 20 MG PO TABS: 20.0000 mg | ORAL_TABLET | ORAL | 2 refills | Status: DC | PRN

## 2020-05-18 MED ORDER — TAMSULOSIN HCL 0.4 MG PO CAPS: 0.4000 mg | ORAL_CAPSULE | Freq: Every day | ORAL | 3 refills | Status: DC

## 2020-05-18 NOTE — Progress Notes (Signed)
BP (!) 144/81   Pulse 69   Ht $R'6\' 2"'BU$  (1.88 m)   Wt 189 lb (85.7 kg)   SpO2 98%   BMI 24.27 kg/m    Subjective:   Patient ID: Manuel Kidd, male    DOB: 12-13-1950, 69 y.o.   MRN: [id number]  HPI: Manuel Kidd is a 70 y.o. male presenting on 05/18/2020 for Medical Management of Chronic Issues (CPE), Hypertension, and Hyperlipidemia   HPI Physical exam Adult well exam and physical for checkup today for patient denies any major health issues. Patient denies any chest pain, shortness of breath, headaches or vision issues, abdominal complaints, diarrhea, nausea, vomiting, or joint issues.   Hyperlipidemia Patient is coming in for recheck of his hyperlipidemia. The patient is currently taking no medication, did not want to try the pravastatin but is taking fish oils over-the-counter.. They deny any issues with myalgias or history of liver damage from it. They deny any focal numbness or weakness or chest pain.   Hypertension Patient is currently on no medication, and their blood pressure today is 144/81 but says it runs in the 120s over 70s at home. Patient denies any lightheadedness or dizziness. Patient denies headaches, blurred vision, chest pains, shortness of breath, or weakness. Denies any side effects from medication and is content with current medication.    Patient does have some erectile dysfunction issues and some decreased urinary stream and has urinary frequency more throughout the day and in the evening.  He says his stream is weaker and would like to try something to help with that.  Relevant past medical, surgical, family and social history reviewed and updated as indicated. Interim medical history since our last visit reviewed. Allergies and medications reviewed and updated.  Review of Systems  Constitutional: Negative for chills and fever.  HENT: Negative for ear pain and tinnitus.   Eyes: Negative for pain.  Respiratory: Negative for cough, shortness of breath and  wheezing.   Cardiovascular: Negative for chest pain, palpitations and leg swelling.  Gastrointestinal: Negative for abdominal pain, blood in stool, constipation and diarrhea.  Genitourinary: Positive for decreased urine volume. Negative for dysuria, hematuria, penile discharge and penile pain.  Musculoskeletal: Negative for back pain and myalgias.  Skin: Negative for rash.  Neurological: Negative for dizziness, weakness and headaches.  Psychiatric/Behavioral: Negative for suicidal ideas.    Per HPI unless specifically indicated above   Allergies as of 05/18/2020   No Known Allergies     Medication List       Accurate as of May 18, 2020 10:00 AM. If you have any questions, ask your nurse or doctor.        STOP taking these medications   pravastatin 10 MG tablet Commonly known as: PRAVACHOL Stopped by: Fransisca Kaufmann Lyndsay Talamante, MD     TAKE these medications   aspirin 81 MG tablet Take 81 mg by mouth daily.   Fish Oil 1000 MG Caps Take 1 capsule by mouth daily. 5 days a week   multivitamin tablet Take 1 tablet by mouth daily.   sildenafil 20 MG tablet Commonly known as: REVATIO Take 1-3 tablets (20-60 mg total) by mouth as needed. Started by: Worthy Rancher, MD   tamsulosin 0.4 MG Caps capsule Commonly known as: FLOMAX Take 1 capsule (0.4 mg total) by mouth daily. Started by: Worthy Rancher, MD   valACYclovir 1000 MG tablet Commonly known as: VALTREX Take 1 tablet (1,000 mg total) by mouth 2 (two) times daily.  VITAMIN D (ERGOCALCIFEROL) PO Take 2,000 Units by mouth daily. 5 days a week        Objective:   BP (!) 144/81   Pulse 69   Ht $R'6\' 2"'oQ$  (1.88 m)   Wt 189 lb (85.7 kg)   SpO2 98%   BMI 24.27 kg/m   Wt Readings from Last 3 Encounters:  05/18/20 189 lb (85.7 kg)  11/18/19 193 lb (87.5 kg)  05/18/19 193 lb 6 oz (87.7 kg)    Physical Exam Vitals and nursing note reviewed.  Constitutional:      General: He is not in acute distress.     Appearance: He is well-developed. He is not diaphoretic.  HENT:     Right Ear: Tympanic membrane and ear canal normal.     Left Ear: Tympanic membrane and ear canal normal.     Mouth/Throat:     Mouth: Mucous membranes are moist.     Pharynx: Oropharynx is clear. No oropharyngeal exudate or posterior oropharyngeal erythema.  Eyes:     General: No scleral icterus.    Extraocular Movements: Extraocular movements intact.     Conjunctiva/sclera: Conjunctivae normal.     Pupils: Pupils are equal, round, and reactive to light.  Neck:     Thyroid: No thyromegaly.  Cardiovascular:     Rate and Rhythm: Normal rate and regular rhythm.     Heart sounds: Normal heart sounds. No murmur heard.   Pulmonary:     Effort: Pulmonary effort is normal. No respiratory distress.     Breath sounds: Normal breath sounds. No wheezing.  Abdominal:     General: Abdomen is flat. Bowel sounds are normal.  Genitourinary:    Prostate: Enlarged. Not tender and no nodules present.     Rectum: External hemorrhoid present. No tenderness, anal fissure or internal hemorrhoid. Normal anal tone.  Musculoskeletal:        General: Normal range of motion.     Cervical back: Neck supple.  Lymphadenopathy:     Cervical: No cervical adenopathy.  Skin:    General: Skin is warm and dry.     Findings: No rash.  Neurological:     Mental Status: He is alert and oriented to person, place, and time.     Coordination: Coordination normal.  Psychiatric:        Behavior: Behavior normal.     Results for orders placed or performed in visit on 05/11/20  PSA, total and free  Result Value Ref Range   Prostate Specific Ag, Serum 4.7 (H) 0.0 - 4.0 ng/mL   PSA, Free 0.97 N/A ng/mL   PSA, Free Pct 20.6 %  Lipid panel  Result Value Ref Range   Cholesterol, Total 175 100 - 199 mg/dL   Triglycerides 55 0 - 149 mg/dL   HDL 48 >39 mg/dL   VLDL Cholesterol Cal 11 5 - 40 mg/dL   LDL Chol Calc (NIH) 116 (H) 0 - 99 mg/dL   Chol/HDL  Ratio 3.6 0.0 - 5.0 ratio  CMP14+EGFR  Result Value Ref Range   Glucose 96 65 - 99 mg/dL   BUN 16 8 - 27 mg/dL   Creatinine, Ser 1.00 0.76 - 1.27 mg/dL   eGFR 81 >59 mL/min/1.73   BUN/Creatinine Ratio 16 10 - 24   Sodium 140 134 - 144 mmol/L   Potassium 4.9 3.5 - 5.2 mmol/L   Chloride 105 96 - 106 mmol/L   CO2 25 20 - 29 mmol/L   Calcium 9.7 8.6 -  10.2 mg/dL   Total Protein 6.6 6.0 - 8.5 g/dL   Albumin 4.6 3.8 - 4.8 g/dL   Globulin, Total 2.0 1.5 - 4.5 g/dL   Albumin/Globulin Ratio 2.3 (H) 1.2 - 2.2   Bilirubin Total 0.6 0.0 - 1.2 mg/dL   Alkaline Phosphatase 62 44 - 121 IU/L   AST 24 0 - 40 IU/L   ALT 14 0 - 44 IU/L  CBC with Differential/Platelet  Result Value Ref Range   WBC 4.3 3.4 - 10.8 x10E3/uL   RBC 5.68 4.14 - 5.80 x10E6/uL   Hemoglobin 16.0 13.0 - 17.7 g/dL   Hematocrit 48.3 37.5 - 51.0 %   MCV 85 79 - 97 fL   MCH 28.2 26.6 - 33.0 pg   MCHC 33.1 31.5 - 35.7 g/dL   RDW 12.3 11.6 - 15.4 %   Platelets 175 150 - 450 x10E3/uL   Neutrophils 63 Not Estab. %   Lymphs 22 Not Estab. %   Monocytes 8 Not Estab. %   Eos 6 Not Estab. %   Basos 1 Not Estab. %   Neutrophils Absolute 2.7 1.4 - 7.0 x10E3/uL   Lymphocytes Absolute 0.9 0.7 - 3.1 x10E3/uL   Monocytes Absolute 0.4 0.1 - 0.9 x10E3/uL   EOS (ABSOLUTE) 0.2 0.0 - 0.4 x10E3/uL   Basophils Absolute 0.1 0.0 - 0.2 x10E3/uL   Immature Granulocytes 0 Not Estab. %   Immature Grans (Abs) 0.0 0.0 - 0.1 x10E3/uL    Assessment & Plan:   Problem List Items Addressed This Visit      Cardiovascular and Mediastinum   Hypertension   Relevant Medications   sildenafil (REVATIO) 20 MG tablet     Genitourinary   Benign prostatic hyperplasia   Relevant Medications   tamsulosin (FLOMAX) 0.4 MG CAPS capsule   Other Relevant Orders   Testosterone,Free and Total     Other   Hyperlipidemia   Relevant Medications   sildenafil (REVATIO) 20 MG tablet    Other Visit Diagnoses    Well adult exam    -  Primary   Erectile  dysfunction, unspecified erectile dysfunction type       Relevant Orders   Testosterone,Free and Total    Will start Flomax, encouraged pravastatin because cholesterol is still slightly elevated.  Patient does want to get his testosterone test will come back next week to do that.  Follow up plan: Return in about 6 months (around 11/18/2020), or if symptoms worsen or fail to improve, for Hypertension and hyperlipidemia recheck and BPH.  Counseling provided for all of the vaccine components Orders Placed This Encounter  Procedures  . Testosterone,Free and Total    Caryl Pina, MD [facility] 05/18/2020, 10:00 AM

## 2020-05-21 ENCOUNTER — Other Ambulatory Visit: Payer: Self-pay

## 2020-05-21 ENCOUNTER — Other Ambulatory Visit: Payer: Medicare PPO

## 2020-05-21 DIAGNOSIS — N529 Male erectile dysfunction, unspecified: Secondary | ICD-10-CM

## 2020-05-21 DIAGNOSIS — N400 Enlarged prostate without lower urinary tract symptoms: Secondary | ICD-10-CM

## 2020-05-23 LAB — TESTOSTERONE,FREE AND TOTAL
Testosterone, Free: 5.4 pg/mL — ABNORMAL LOW (ref 6.6–18.1)
Testosterone: 583 ng/dL (ref 264–916)

## 2020-06-08 ENCOUNTER — Other Ambulatory Visit: Payer: Self-pay | Admitting: Family Medicine

## 2020-06-08 NOTE — Telephone Encounter (Signed)
Last office visit 05/18/20 Last refill 11/15/18, #20, no refills

## 2020-07-23 DIAGNOSIS — H04123 Dry eye syndrome of bilateral lacrimal glands: Secondary | ICD-10-CM | POA: Diagnosis not present

## 2020-10-29 DIAGNOSIS — L814 Other melanin hyperpigmentation: Secondary | ICD-10-CM | POA: Diagnosis not present

## 2020-10-29 DIAGNOSIS — L578 Other skin changes due to chronic exposure to nonionizing radiation: Secondary | ICD-10-CM | POA: Diagnosis not present

## 2020-10-29 DIAGNOSIS — L570 Actinic keratosis: Secondary | ICD-10-CM | POA: Diagnosis not present

## 2020-10-29 DIAGNOSIS — D485 Neoplasm of uncertain behavior of skin: Secondary | ICD-10-CM | POA: Diagnosis not present

## 2020-10-29 DIAGNOSIS — L821 Other seborrheic keratosis: Secondary | ICD-10-CM | POA: Diagnosis not present

## 2020-10-29 DIAGNOSIS — D1801 Hemangioma of skin and subcutaneous tissue: Secondary | ICD-10-CM | POA: Diagnosis not present

## 2020-10-29 DIAGNOSIS — Z23 Encounter for immunization: Secondary | ICD-10-CM | POA: Diagnosis not present

## 2020-11-05 ENCOUNTER — Other Ambulatory Visit: Payer: Self-pay

## 2020-11-05 DIAGNOSIS — E782 Mixed hyperlipidemia: Secondary | ICD-10-CM

## 2020-11-05 DIAGNOSIS — I10 Essential (primary) hypertension: Secondary | ICD-10-CM

## 2020-11-05 DIAGNOSIS — N400 Enlarged prostate without lower urinary tract symptoms: Secondary | ICD-10-CM

## 2020-11-07 ENCOUNTER — Other Ambulatory Visit: Payer: Medicare PPO

## 2020-11-07 ENCOUNTER — Other Ambulatory Visit: Payer: Self-pay

## 2020-11-07 DIAGNOSIS — E782 Mixed hyperlipidemia: Secondary | ICD-10-CM

## 2020-11-07 DIAGNOSIS — I10 Essential (primary) hypertension: Secondary | ICD-10-CM | POA: Diagnosis not present

## 2020-11-07 DIAGNOSIS — N400 Enlarged prostate without lower urinary tract symptoms: Secondary | ICD-10-CM

## 2020-11-08 LAB — CBC WITH DIFFERENTIAL/PLATELET
Basophils Absolute: 0.1 10*3/uL (ref 0.0–0.2)
Basos: 1 %
EOS (ABSOLUTE): 0.3 10*3/uL (ref 0.0–0.4)
Eos: 7 %
Hematocrit: 46.9 % (ref 37.5–51.0)
Hemoglobin: 16 g/dL (ref 13.0–17.7)
Immature Grans (Abs): 0 10*3/uL (ref 0.0–0.1)
Immature Granulocytes: 0 %
Lymphocytes Absolute: 1 10*3/uL (ref 0.7–3.1)
Lymphs: 21 %
MCH: 28.6 pg (ref 26.6–33.0)
MCHC: 34.1 g/dL (ref 31.5–35.7)
MCV: 84 fL (ref 79–97)
Monocytes Absolute: 0.4 10*3/uL (ref 0.1–0.9)
Monocytes: 9 %
Neutrophils Absolute: 2.9 10*3/uL (ref 1.4–7.0)
Neutrophils: 62 %
Platelets: 165 10*3/uL (ref 150–450)
RBC: 5.6 x10E6/uL (ref 4.14–5.80)
RDW: 11.9 % (ref 11.6–15.4)
WBC: 4.6 10*3/uL (ref 3.4–10.8)

## 2020-11-08 LAB — CMP14+EGFR
ALT: 14 IU/L (ref 0–44)
AST: 26 IU/L (ref 0–40)
Albumin/Globulin Ratio: 2.2 (ref 1.2–2.2)
Albumin: 4.9 g/dL — ABNORMAL HIGH (ref 3.8–4.8)
Alkaline Phosphatase: 64 IU/L (ref 44–121)
BUN/Creatinine Ratio: 16 (ref 10–24)
BUN: 15 mg/dL (ref 8–27)
Bilirubin Total: 0.9 mg/dL (ref 0.0–1.2)
CO2: 27 mmol/L (ref 20–29)
Calcium: 9.7 mg/dL (ref 8.6–10.2)
Chloride: 102 mmol/L (ref 96–106)
Creatinine, Ser: 0.91 mg/dL (ref 0.76–1.27)
Globulin, Total: 2.2 g/dL (ref 1.5–4.5)
Glucose: 85 mg/dL (ref 70–99)
Potassium: 4.7 mmol/L (ref 3.5–5.2)
Sodium: 140 mmol/L (ref 134–144)
Total Protein: 7.1 g/dL (ref 6.0–8.5)
eGFR: 91 mL/min/{1.73_m2} (ref >59)

## 2020-11-08 LAB — PSA, TOTAL AND FREE
PSA, Free Pct: 22.8 %
PSA, Free: 1.05 ng/mL
Prostate Specific Ag, Serum: 4.6 ng/mL — ABNORMAL HIGH (ref 0.0–4.0)

## 2020-11-08 LAB — LIPID PANEL
Chol/HDL Ratio: 3.7 ratio (ref 0.0–5.0)
Cholesterol, Total: 182 mg/dL (ref 100–199)
HDL: 49 mg/dL (ref >39)
LDL Chol Calc (NIH): 121 mg/dL — ABNORMAL HIGH (ref 0–99)
Triglycerides: 63 mg/dL (ref 0–149)
VLDL Cholesterol Cal: 12 mg/dL (ref 5–40)

## 2020-11-08 LAB — TESTOSTERONE,FREE AND TOTAL
Testosterone, Free: 8.5 pg/mL (ref 6.6–18.1)
Testosterone: 621 ng/dL (ref 264–916)

## 2020-11-16 ENCOUNTER — Other Ambulatory Visit: Payer: Self-pay

## 2020-11-16 ENCOUNTER — Encounter: Payer: Self-pay | Admitting: Family Medicine

## 2020-11-16 ENCOUNTER — Ambulatory Visit: Payer: Medicare PPO | Admitting: Family Medicine

## 2020-11-16 VITALS — BP 146/73 | HR 67 | Ht 74.0 in | Wt 189.0 lb

## 2020-11-16 DIAGNOSIS — I10 Essential (primary) hypertension: Secondary | ICD-10-CM | POA: Diagnosis not present

## 2020-11-16 DIAGNOSIS — E782 Mixed hyperlipidemia: Secondary | ICD-10-CM

## 2020-11-16 DIAGNOSIS — I700 Atherosclerosis of aorta: Secondary | ICD-10-CM | POA: Diagnosis not present

## 2020-11-16 MED ORDER — VALACYCLOVIR HCL 1 G PO TABS
1000.0000 mg | ORAL_TABLET | Freq: Two times a day (BID) | ORAL | 0 refills | Status: DC
Start: 2020-11-16 — End: 2022-10-03

## 2020-11-16 NOTE — Progress Notes (Signed)
BP (!) 146/73   Pulse 67   Ht _0  (1.88 m)   Wt 189 lb (85.7 kg)   SpO2 99%   BMI 24.27 kg/m    Subjective:   Patient ID: Manuel Kidd, male    DOB: Jan 16, 1950, 70 y.o.   MRN: [id number]  HPI: Manuel Kidd is a 70 y.o. male presenting on 11/16/2020 for Medical Management of Chronic Issues and Hyperlipidemia   HPI Hyperlipidemia and aortic atherosclerosis Patient is coming in for recheck of his hyperlipidemia. The patient is currently taking fish oils. They deny any issues with myalgias or history of liver damage from it. They deny any focal numbness or weakness or chest pain.  Cholesterol is mildly elevated  Hypertension Patient is currently on no medication and has been diet controlled, and their blood pressure today is 146/73 but says that is actually higher than it normally runs at home. Patient denies any lightheadedness or dizziness. Patient denies headaches, blurred vision, chest pains, shortness of breath, or weakness. Denies any side effects from medication and is content with current medication.   Relevant past medical, surgical, family and social history reviewed and updated as indicated. Interim medical history since our last visit reviewed. Allergies and medications reviewed and updated.  Review of Systems  Constitutional:  Negative for chills and fever.  Eyes:  Negative for visual disturbance.  Respiratory:  Negative for shortness of breath and wheezing.   Cardiovascular:  Negative for chest pain and leg swelling.  Musculoskeletal:  Negative for back pain and gait problem.  Skin:  Negative for rash.  Neurological:  Negative for dizziness, weakness and light-headedness.  All other systems reviewed and are negative.  Per HPI unless specifically indicated above   Allergies as of 11/16/2020   No Known Allergies      Medication List        Accurate as of November 16, 2020  9:12 AM. If you have any questions, ask your nurse or doctor.          STOP taking  these medications    aspirin 81 MG tablet Stopped by: Manuel Rancher, MD   sildenafil 20 MG tablet Commonly known as: REVATIO Stopped by: Manuel Kaufmann Ecko Beasley, MD   tamsulosin 0.4 MG Caps capsule Commonly known as: FLOMAX Stopped by: Manuel Kaufmann Manuel Gauna, MD       TAKE these medications    cycloSPORINE 0.05 % ophthalmic emulsion Commonly known as: RESTASIS Place 1 drop into both eyes 2 (two) times daily.   Fish Oil 1000 MG Caps Take 1 capsule by mouth daily. 5 days a week   multivitamin tablet Take 1 tablet by mouth daily.   prednisoLONE acetate 0.12 % ophthalmic suspension Commonly known as: PRED MILD Place 1 drop into both eyes as needed.   valACYclovir 1000 MG tablet Commonly known as: VALTREX Take 1 tablet (1,000 mg total) by mouth 2 (two) times daily.   VITAMIN D (ERGOCALCIFEROL) PO Take 2,000 Units by mouth daily. 5 days a week         Objective:   BP (!) 146/73   Pulse 67   Ht _1  (1.88 m)   Wt 189 lb (85.7 kg)   SpO2 99%   BMI 24.27 kg/m   Wt Readings from Last 3 Encounters:  11/16/20 189 lb (85.7 kg)  05/18/20 189 lb (85.7 kg)  11/18/19 193 lb (87.5 kg)    Physical Exam Vitals and nursing note reviewed.  Constitutional:  General: He is not in acute distress.    Appearance: He is well-developed. He is not diaphoretic.  Eyes:     General: No scleral icterus.    Conjunctiva/sclera: Conjunctivae normal.  Neck:     Thyroid: No thyromegaly.  Cardiovascular:     Rate and Rhythm: Normal rate and regular rhythm.     Heart sounds: Normal heart sounds. No murmur heard. Pulmonary:     Effort: Pulmonary effort is normal. No respiratory distress.     Breath sounds: Normal breath sounds. No wheezing.  Musculoskeletal:        General: Normal range of motion.     Cervical back: Neck supple.  Lymphadenopathy:     Cervical: No cervical adenopathy.  Skin:    General: Skin is warm and dry.     Findings: No rash.  Neurological:     Mental  Status: He is alert and oriented to person, place, and time.     Coordination: Coordination normal.  Psychiatric:        Behavior: Behavior normal.    Results for orders placed or performed in visit on 11/07/20  Testosterone,Free and Total  Result Value Ref Range   Testosterone 621 264 - 916 ng/dL   Testosterone, Free 8.5 6.6 - 18.1 pg/mL  PSA, total and free  Result Value Ref Range   Prostate Specific Ag, Serum 4.6 (H) 0.0 - 4.0 ng/mL   PSA, Free 1.05 N/A ng/mL   PSA, Free Pct 22.8 %  Lipid panel  Result Value Ref Range   Cholesterol, Total 182 100 - 199 mg/dL   Triglycerides 63 0 - 149 mg/dL   HDL 49 >28 mg/dL   VLDL Cholesterol Cal 12 5 - 40 mg/dL   LDL Chol Calc (NIH) 473 (H) 0 - 99 mg/dL   Chol/HDL Ratio 3.7 0.0 - 5.0 ratio  CMP14+EGFR  Result Value Ref Range   Glucose 85 70 - 99 mg/dL   BUN 15 8 - 27 mg/dL   Creatinine, Ser 4.65 0.76 - 1.27 mg/dL   eGFR 91 >74 DI/FFK/2.28   BUN/Creatinine Ratio 16 10 - 24   Sodium 140 134 - 144 mmol/L   Potassium 4.7 3.5 - 5.2 mmol/L   Chloride 102 96 - 106 mmol/L   CO2 27 20 - 29 mmol/L   Calcium 9.7 8.6 - 10.2 mg/dL   Total Protein 7.1 6.0 - 8.5 g/dL   Albumin 4.9 (H) 3.8 - 4.8 g/dL   Globulin, Total 2.2 1.5 - 4.5 g/dL   Albumin/Globulin Ratio 2.2 1.2 - 2.2   Bilirubin Total 0.9 0.0 - 1.2 mg/dL   Alkaline Phosphatase 64 44 - 121 IU/L   AST 26 0 - 40 IU/L   ALT 14 0 - 44 IU/L  CBC with Differential/Platelet  Result Value Ref Range   WBC 4.6 3.4 - 10.8 x10E3/uL   RBC 5.60 4.14 - 5.80 x10E6/uL   Hemoglobin 16.0 13.0 - 17.7 g/dL   Hematocrit 00.3 66.4 - 51.0 %   MCV 84 79 - 97 fL   MCH 28.6 26.6 - 33.0 pg   MCHC 34.1 31.5 - 35.7 g/dL   RDW 70.4 35.5 - 78.0 %   Platelets 165 150 - 450 x10E3/uL   Neutrophils 62 Not Estab. %   Lymphs 21 Not Estab. %   Monocytes 9 Not Estab. %   Eos 7 Not Estab. %   Basos 1 Not Estab. %   Neutrophils Absolute 2.9 1.4 - 7.0 x10E3/uL  Lymphocytes Absolute 1.0 0.7 - 3.1 x10E3/uL   Monocytes  Absolute 0.4 0.1 - 0.9 x10E3/uL   EOS (ABSOLUTE) 0.3 0.0 - 0.4 x10E3/uL   Basophils Absolute 0.1 0.0 - 0.2 x10E3/uL   Immature Granulocytes 0 Not Estab. %   Immature Grans (Abs) 0.0 0.0 - 0.1 x10E3/uL    Assessment & Plan:   Problem List Items Addressed This Visit       Cardiovascular and Mediastinum   Aortic atherosclerosis ([place])   Hypertension     Other   Hyperlipidemia - Primary    Continue current medicine no changes. Follow up plan: Return in about 6 months (around 05/16/2021), or if symptoms worsen or fail to improve, for Hypertension and cholesterol.  Counseling provided for all of the vaccine components No orders of the defined types were placed in this encounter.   Caryl Pina, MD [facility] 11/16/2020, 9:12 AM

## 2020-11-19 ENCOUNTER — Ambulatory Visit (INDEPENDENT_AMBULATORY_CARE_PROVIDER_SITE_OTHER): Payer: Medicare PPO

## 2020-11-19 ENCOUNTER — Ambulatory Visit: Payer: Medicare PPO | Admitting: Orthopaedic Surgery

## 2020-11-19 ENCOUNTER — Other Ambulatory Visit: Payer: Self-pay

## 2020-11-19 DIAGNOSIS — M7541 Impingement syndrome of right shoulder: Secondary | ICD-10-CM | POA: Diagnosis not present

## 2020-11-19 DIAGNOSIS — M25511 Pain in right shoulder: Secondary | ICD-10-CM

## 2020-11-19 MED ORDER — METHYLPREDNISOLONE ACETATE 40 MG/ML IJ SUSP
40.0000 mg | INTRAMUSCULAR | Status: AC | PRN
  Administered 2020-11-19: 40 mg via INTRA_ARTICULAR

## 2020-11-19 MED ORDER — LIDOCAINE HCL 1 % IJ SOLN
3.0000 mL | INTRAMUSCULAR | Status: AC | PRN
  Administered 2020-11-19: 3 mL

## 2020-11-19 NOTE — Progress Notes (Signed)
Office Visit Note   Patient: Manuel Kidd           Date of Birth: 1950/08/07           MRN: [id number] Visit Date: 11/19/2020              Requested by: Nils Pyle, MD [address] [place],  [place] [address] PCP: Dettinger, Elige Radon, MD   Assessment & Plan: Visit Diagnoses:  1. Acute pain of right shoulder   2. Impingement syndrome of right shoulder     Plan: His signs and symptoms are consistent  with impingement syndrome of the right shoulder.  I recommended a steroid injection of the subacromial outlet and he agreed to this and tolerated well.  He does have some AC joint arthritis on the x-rays.  I showed him some exercises to try and he did tolerate the steroid injection well.  I would like him to take 1 Aleve twice a day for the next 7 to 10 days.  We will reevaluate him in 4 weeks.  All questions and concerns were answered and addressed.  Follow-Up Instructions: Return in about 4 weeks (around 12/17/2020).    Orders:  Orders Placed This Encounter  Procedures   Large Joint Inj   XR Shoulder Right   No orders of the defined types were placed in this encounter.     Procedures: Large Joint Inj: R subacromial bursa on 11/19/2020 9:13 AM Indications: pain and diagnostic evaluation Details: 22 G 1.5 in needle  Arthrogram: No  Medications: 3 mL lidocaine 1 %; 40 mg methylPREDNISolone acetate 40 MG/ML Outcome: tolerated well, no immediate complications Procedure, treatment alternatives, risks and benefits explained, specific risks discussed. Consent was given by the patient. Immediately prior to procedure a time out was called to verify the correct patient, procedure, equipment, support staff and site/side marked as required. Patient was prepped and draped in the usual sterile fashion.      Clinical Data: No additional findings.   Subjective: Chief Complaint  Patient presents with   Right Shoulder - Pain  The patient comes in today with right shoulder pain this been going on for about a month with no known injury.  Hurts reaching behind him and overhead with the right shoulder.  He denies any neck pain or any radicular symptoms going down his arm and hand.  He is 70 years old and very active.  He is not a diabetic.  HPI  Review of Systems There is currently listed no headache, chest pain, shortness of breath, fever, chills, nausea, vomiting  Objective: Vital Signs: There were no vitals taken for this visit.  Physical Exam He is alert and orient x3 and in no acute distress Ortho Exam Examination of his left shoulder is normal examination of his right shoulder is almost normal except for reaching behind him he reaches to the mid lumbar spine where he can reach to the thoracic spine with his left shoulder.  He has no signs of shoulder weakness but there is signs of impingement. Specialty Comments:  No specialty comments available.  Imaging: XR Shoulder Right  Result Date:  11/19/2020 3 views of the right shoulder show no acute findings.    PMFS History: Patient Active Problem List   Diagnosis Date Noted   Hypertension 05/18/2020   Dupuytren contracture 06/21/2018   Abnormal CT scan of lung 10/11/2016   Aortic atherosclerosis (HCC) 11/16/2015   Solitary pulmonary nodule 06/20/2015   Hyperlipidemia 08/16/2012   Benign prostatic hyperplasia 08/16/2012   Vitamin D deficiency    Difficult airway for intubation    Past Medical History:  Diagnosis Date   Difficult airway for intubation    per wife,during gallbladder sx   Hyperlipidemia    Vitamin D deficiency     Family History  Problem Relation Age of Onset   Colon polyps Mother    Heart failure Mother        died age 70   Colon polyps Father    CAD Brother 83       MIs   Colon cancer Neg Hx  Past Surgical History:  Procedure Laterality Date   CHOLECYSTECTOMY     HAND SURGERY Left    Social History   Occupational History   Occupation: Lexicographer: [employer]  Tobacco Use   Smoking status: Never   Smokeless tobacco: Never  Vaping Use   Vaping Use: Never used  Substance and Sexual Activity   Alcohol use: No   Drug use: No   Sexual activity: Not on file

## 2020-11-22 ENCOUNTER — Telehealth: Payer: Self-pay | Admitting: Family Medicine

## 2020-11-22 NOTE — Telephone Encounter (Signed)
Patient returned call  Declined

## 2020-11-22 NOTE — Telephone Encounter (Signed)
Left message for patient to call back and schedule Medicare Annual Wellness Visit (AWV) either virtually or phone I left my number for patient to call [phone].  *due 07/14/2018 awvi per [facility]  please schedule at anytime with health coach  This should be a 45 minute visit.    

## 2020-12-13 ENCOUNTER — Encounter: Payer: Self-pay | Admitting: Internal Medicine

## 2020-12-17 ENCOUNTER — Other Ambulatory Visit: Payer: Self-pay

## 2020-12-17 ENCOUNTER — Encounter: Payer: Self-pay | Admitting: Orthopaedic Surgery

## 2020-12-17 ENCOUNTER — Ambulatory Visit: Payer: Medicare PPO | Admitting: Orthopaedic Surgery

## 2020-12-17 DIAGNOSIS — M7541 Impingement syndrome of right shoulder: Secondary | ICD-10-CM

## 2020-12-17 DIAGNOSIS — M25511 Pain in right shoulder: Secondary | ICD-10-CM

## 2020-12-17 MED ORDER — MELOXICAM 15 MG PO TABS: 15.0000 mg | ORAL_TABLET | Freq: Every day | ORAL | 3 refills | Status: DC

## 2020-12-17 MED ORDER — METHYLPREDNISOLONE 4 MG PO TABS: ORAL_TABLET | ORAL | 0 refills | Status: DC

## 2020-12-17 NOTE — Progress Notes (Signed)
The patient is well-known to me.  He has right shoulder impingement syndrome.  He is 70 years old.  I did provide a steroid injection 4 weeks ago in the right shoulder subacromial outlet.  He said that is helped some but he still uncomfortable with reaching behind him.  He does wake him up at night as well.  He is an active 70 year old gentleman.  On exam he does have excellent strength in his rotator cuff.  He does have some pain with internal rotation combined with abduction and reaching behind him and there is stiffness.  I like to send him to outpatient physical therapy at [facility] outpatient facility in [facility].  Any modalities that can help improve his shoulder motion and decrease his pain would be warranted.  This will be per the therapist discretion.  I will send in a steroid taper and meloxicam to see if this will help.  I will then see him back in 6 weeks and we can always repeat an injection then if needed.  All questions concerns were answered and addressed.

## 2020-12-24 ENCOUNTER — Ambulatory Visit: Payer: Medicare PPO | Attending: Orthopaedic Surgery

## 2020-12-24 ENCOUNTER — Other Ambulatory Visit: Payer: Self-pay

## 2020-12-24 DIAGNOSIS — M7541 Impingement syndrome of right shoulder: Secondary | ICD-10-CM | POA: Insufficient documentation

## 2020-12-24 DIAGNOSIS — M25611 Stiffness of right shoulder, not elsewhere classified: Secondary | ICD-10-CM | POA: Insufficient documentation

## 2020-12-24 DIAGNOSIS — M25511 Pain in right shoulder: Secondary | ICD-10-CM | POA: Diagnosis not present

## 2020-12-24 NOTE — Therapy (Signed)
[facility] [address] [place], [place], [address] Phone: [phone]   Fax:  [phone]  Physical Therapy Evaluation  Patient Details  Name: Manuel Kidd MRN: [id number] Date of Birth: 03/17/1950 Referring Provider (PT): Magnus Ivan   Encounter Date: 12/24/2020   PT End of Session - 12/24/20 0808     Visit Number 1    Number of Visits 10    Date for PT Re-Evaluation 03/22/21    PT Start Time 0820    PT Stop Time 0855    PT Time Calculation (min) 35 min    Activity Tolerance Patient tolerated treatment well    Behavior During Therapy [facility] for tasks assessed/performed             Past Medical History:  Diagnosis Date   Difficult airway for intubation    per wife,during gallbladder sx   Hyperlipidemia    Vitamin D deficiency     Past Surgical History:  Procedure Laterality Date   CHOLECYSTECTOMY     HAND SURGERY Left     There were no vitals filed for this visit.    Subjective Assessment - 12/24/20 0808     Subjective Patient reports that his right shoulder has been bothering him for about 6 weeks. However, he his is unsure of anything that caused his pain to start. His biggest problem is reaching behind his back with his right hand. He notes that he can get some pain shooting down to his fingers when he does this motion. He was told that he has some inflamation in the joint.    Pertinent History HTN    Patient Stated Goals be able to reach behind him without pain    Currently in Pain? Yes    Pain Score 8     Pain Location Shoulder    Pain Orientation Right    Pain Descriptors / Indicators Shooting;Sharp    Pain Type Acute pain    Pain Radiating Towards to the fingers    Pain Onset More than a month ago    Pain Frequency Intermittent    Aggravating Factors  reaching behind his back    Pain Relieving Factors relaxing arm    Effect of Pain on Daily Activities limits his ability to reach behind his back                 [facility] PT Assessment - 12/24/20 0001       Assessment   Medical Diagnosis Acute pain of right shoulder    Referring Provider (PT) Magnus Ivan    Onset Date/Surgical Date --   6 weeks ago   Hand Dominance Left    Next MD Visit --   5 weeks from initial evaluation   Prior Therapy No      Precautions   Precautions None      Restrictions   Weight Bearing Restrictions No      Balance Screen   Has the patient fallen in the past 6 months No    Has the patient had a decrease in activity level because of a fear of falling?  No    Is the patient reluctant to leave their home because of a fear of falling?  No      Home [employer]ist information centre manager residence      Prior Function   Level of Independence Independent    Vocation Retired    Engineer, mining   Overall  Cognitive Status Within Functional Limits for tasks assessed    Attention Focused    Focused Attention Appears intact    Memory Appears intact    Awareness Appears intact    Problem Solving Appears intact      Sensation   Additional Comments Patient reports no numbness or tingling      ROM / Strength   AROM / PROM / Strength AROM;Strength      AROM   AROM Assessment Site Shoulder    Right/Left Shoulder Left;Right    Right Shoulder Flexion 127 Degrees    Right Shoulder ABduction 119 Degrees    Right Shoulder Internal Rotation --   L1; "irritation"   Right Shoulder External Rotation --   T4   Left Shoulder Flexion 145 Degrees    Left Shoulder ABduction 143 Degrees    Left Shoulder Internal Rotation --   T5   Left Shoulder External Rotation --   T5     Strength   Strength Assessment Site Shoulder    Right/Left Shoulder Right;Left    Right Shoulder Flexion 4+/5    Right Shoulder ABduction 4+/5    Right Shoulder Internal Rotation 4+/5    Right Shoulder External Rotation 4+/5    Left Shoulder Flexion 4+/5    Left Shoulder ABduction 4+/5    Left Shoulder Internal  Rotation 5/5    Left Shoulder External Rotation 4+/5      Palpation   Palpation comment TTP: right upper trapezius ("sore"), short head of biceps, subscapularis,   GH joint mobility: hypomobile and nonpainful     Special Tests   Other special tests Unable to assess due to muscle guarding                        Objective measurements completed on examination: See above findings.       [facility] Adult PT Treatment/Exercise - 12/24/20 0001       Exercises   Exercises Shoulder      Shoulder Exercises: Stretch   Internal Rotation Stretch 4 reps    Internal Rotation Stretch Limitations 30 seconds each; behind back    Other Shoulder Stretches ER doorway stretch   3x30 seconds     Manual Therapy   Manual Therapy Joint mobilization;Soft tissue mobilization    Joint Mobilization Glenohumeral inferior and posterior grade I-III    Soft tissue mobilization right UT, short head of bicep, and subscapularis                          PT Long Term Goals - 12/24/20 [id number]       PT LONG TERM GOAL #1   Title Patient will be independent with his HEP.    Time 5    Period Weeks    Status New    Target Date 01/28/21      PT LONG TERM GOAL #2   Title Patient will be able to reach behind his back to the level of T8 for improved function washing his back with his RUE.    Time 5    Period Weeks    Status New    Target Date 01/28/21      PT LONG TERM GOAL #3   Title Patient will be able to demonstrate at least 130 degrees of active right shoulder for improved function reaching overhead.    Time 5    Period Weeks    Status New  Target Date 01/28/21                    Plan - 12/24/20 0808     Clinical Impression Statement Patient is a 70 year old male presenting to physical therapy with right shoulder pain that began approximately six weeks ago with no known cause. He presented today with moderate to high pain severity and irritability today. His  primary limitation and concern is functional internal rotation for reaching behind his back. He also exhibits decreased right shoulder flexion and abduction compared to the left. Recommend that he continue with his recommended plan of care to address his remaining impairments to return to his prior level of function.    Personal Factors and Comorbidities Comorbidity 1    Comorbidities HTN    Examination-Activity Limitations Bathing    Examination-Participation Restrictions Other    Stability/Clinical Decision Making Evolving/Moderate complexity    Clinical Decision Making Moderate    Rehab Potential Good    PT Frequency 2x / week    PT Duration Other (comment)   5 weeks   PT Treatment/Interventions Neuromuscular re-education;Therapeutic exercise;Therapeutic activities;Manual techniques;Electrical Stimulation;Moist Heat;Ultrasound;Patient/family education;Taping;Vasopneumatic Device    PT Next Visit Plan UBE, internal rotation stretch, upper extremity strengthening    Consulted and Agree with Plan of Care Patient             Patient will benefit from skilled therapeutic intervention in order to improve the following deficits and impairments:  Decreased range of motion, Impaired UE functional use, Pain, Hypomobility, Decreased strength  Visit Diagnosis: Stiffness of right shoulder, not elsewhere classified  Acute pain of right shoulder     Problem List Patient Active Problem List   Diagnosis Date Noted   Hypertension 05/18/2020   Dupuytren contracture 06/21/2018   Abnormal CT scan of lung 10/11/2016   Aortic atherosclerosis ([place]) 11/16/2015   Solitary pulmonary nodule 06/20/2015   Hyperlipidemia 08/16/2012   Benign prostatic hyperplasia 08/16/2012   Vitamin D deficiency    Difficult airway for intubation     Darlin Coco, PT 12/24/2020, 12:13 PM  [facility] [address] [place], [place], [address] Phone: [phone]    Fax:  [phone]  Name: Manuel Kidd MRN: [id number] Date of Birth: 09-20-1950

## 2020-12-25 ENCOUNTER — Encounter: Payer: Self-pay | Admitting: *Deleted

## 2020-12-26 ENCOUNTER — Encounter: Payer: Medicare PPO | Admitting: Physical Therapy

## 2021-01-09 ENCOUNTER — Other Ambulatory Visit: Payer: Self-pay

## 2021-01-09 ENCOUNTER — Ambulatory Visit: Payer: Medicare PPO

## 2021-01-09 DIAGNOSIS — M25511 Pain in right shoulder: Secondary | ICD-10-CM

## 2021-01-09 DIAGNOSIS — M25611 Stiffness of right shoulder, not elsewhere classified: Secondary | ICD-10-CM

## 2021-01-09 DIAGNOSIS — M7541 Impingement syndrome of right shoulder: Secondary | ICD-10-CM | POA: Diagnosis not present

## 2021-01-09 NOTE — Therapy (Signed)
[facility] [address] [place], [place], [address] Phone: [phone]   Fax:  [phone]  Physical Therapy Treatment  Patient Details  Name: Manuel Kidd MRN: [id number] Date of Birth: 02-26-1950 Referring Provider (PT): Magnus Ivan   Encounter Date: 01/09/2021   PT End of Session - 01/09/21 0806     Visit Number 2    Number of Visits 10    Date for PT Re-Evaluation 03/22/21    PT Start Time 0814    PT Stop Time 0859    PT Time Calculation (min) 45 min    Activity Tolerance Patient tolerated treatment well    Behavior During Therapy [facility] for tasks assessed/performed             Past Medical History:  Diagnosis Date   BPH (benign prostatic hyperplasia)    Difficult airway for intubation    per wife,during gallbladder sx   Hyperlipidemia    Vitamin D deficiency     Past Surgical History:  Procedure Laterality Date   CHOLECYSTECTOMY     HAND SURGERY Left     There were no vitals filed for this visit.   Subjective Assessment - 01/09/21 0806     Subjective Patient reports that his shoulder is a little better than it was at his last appointment. He notes that his arm is still sore and it continues to get irritated by reaching behind his back. However, he has been working on his HEP some, but "probably not as much as I should have." He notes that his shoulder felt really good after his last appointment. However, he rolled onto his right shoulder one night which caused his arm to really start hurting.    Pertinent History HTN    Patient Stated Goals be able to reach behind him without pain    Currently in Pain? Yes    Pain Score --   "better than it was last time."   Pain Location Shoulder    Pain Orientation Right    Pain Descriptors / Indicators Sore    Pain Onset More than a month ago                               [facility] Adult PT Treatment/Exercise - 01/09/21 0001       Shoulder Exercises:  Seated   Other Seated Exercises Self cervical distraction      Shoulder Exercises: Standing   External Rotation Right;Strengthening;Theraband   2 minutes   Theraband Level (Shoulder External Rotation) Level 3 (Green)    Internal Rotation Strengthening;Right;Theraband   2 minutes   Theraband Level (Shoulder Internal Rotation) Level 3 (Green)    Extension Strengthening;Both;Other (comment)   2 minutes   Extension Weight (lbs) Orange XTS      Shoulder Exercises: ROM/Strengthening   UBE (Upper Arm Bike) 4/4 minutes (forward and backward)      Shoulder Exercises: Stretch   Internal Rotation Stretch 10 seconds    Internal Rotation Stretch Limitations 15 reps with arm behind back    Other Shoulder Stretches UT stretch   5x15 second hold     Manual Therapy   Manual Therapy Joint mobilization;Soft tissue mobilization    Joint Mobilization long axis glenohumeral grade I-III    Soft tissue mobilization right UT                          PT Long  Term Goals - 12/24/20 [id number]       PT LONG TERM GOAL #1   Title Patient will be independent with his HEP.    Time 5    Period Weeks    Status New    Target Date 01/28/21      PT LONG TERM GOAL #2   Title Patient will be able to reach behind his back to the level of T8 for improved function washing his back with his RUE.    Time 5    Period Weeks    Status New    Target Date 01/28/21      PT LONG TERM GOAL #3   Title Patient will be able to demonstrate at least 130 degrees of active right shoulder for improved function reaching overhead.    Time 5    Period Weeks    Status New    Target Date 01/28/21                   Plan - 01/09/21 0806     Clinical Impression Statement Patient was introduced to multiple new interventions for improved rotator cuff strengthening and mobility. He required minimal cuing with resisted internal and external rotation for proper biomechanics to facilitate rotator cuff engagement. He  reported no pain or discomfort with any of today's interventions. Manual therapy focused on soft tissue mobilization to the upper trapezius and long axis glenohumeral inferior glide as these were the most effective at reducing his familiar symptoms. Cervical distraction was also found to be effective at reducing his symptoms. He reported that his shoulder felt "100% better" at the conclusion of treatment. He continues to require skilled physical therapy to address his remaining impairments to return to his prior level of function.    Personal Factors and Comorbidities Comorbidity 1    Comorbidities HTN    Examination-Activity Limitations Bathing    Examination-Participation Restrictions Other    Stability/Clinical Decision Making Evolving/Moderate complexity    Rehab Potential Good    PT Frequency 2x / week    PT Duration Other (comment)   5 weeks   PT Treatment/Interventions Neuromuscular re-education;Therapeutic exercise;Therapeutic activities;Manual techniques;Electrical Stimulation;Moist Heat;Ultrasound;Patient/family education;Taping;Vasopneumatic Device    PT Next Visit Plan UBE, internal rotation stretch, upper extremity strengthening    Consulted and Agree with Plan of Care Patient             Patient will benefit from skilled therapeutic intervention in order to improve the following deficits and impairments:  Decreased range of motion, Impaired UE functional use, Pain, Hypomobility, Decreased strength  Visit Diagnosis: Stiffness of right shoulder, not elsewhere classified  Acute pain of right shoulder     Problem List Patient Active Problem List   Diagnosis Date Noted   Hypertension 05/18/2020   Dupuytren contracture 06/21/2018   Abnormal CT scan of lung 10/11/2016   Aortic atherosclerosis ([place]) 11/16/2015   Solitary pulmonary nodule 06/20/2015   Hyperlipidemia 08/16/2012   Benign prostatic hyperplasia 08/16/2012   Vitamin D deficiency    Difficult airway for  intubation     Darlin Coco, PT 01/09/2021, 10:09 AM  [facility] [place], [place], [address] Phone: [phone]   Fax:  [phone]  Name: Anthonyjames Friedel MRN: [id number] Date of Birth: 1950/11/16

## 2021-01-16 ENCOUNTER — Other Ambulatory Visit: Payer: Self-pay

## 2021-01-16 ENCOUNTER — Ambulatory Visit: Payer: Medicare PPO | Attending: Orthopaedic Surgery

## 2021-01-16 DIAGNOSIS — M25611 Stiffness of right shoulder, not elsewhere classified: Secondary | ICD-10-CM | POA: Diagnosis not present

## 2021-01-16 DIAGNOSIS — M25511 Pain in right shoulder: Secondary | ICD-10-CM | POA: Diagnosis not present

## 2021-01-16 NOTE — Therapy (Signed)
[facility] [address] [place], [place], [address] Phone: [phone]   Fax:  [phone]  Physical Therapy Treatment  Patient Details  Name: Manuel Kidd MRN: [id number] Date of Birth: 23-Oct-1950 Referring Provider (PT): Magnus Ivan   Encounter Date: 01/16/2021   PT End of Session - 01/16/21 0924     Visit Number 3    Number of Visits 10    Date for PT Re-Evaluation 03/22/21    PT Start Time 0903    PT Stop Time 0943    PT Time Calculation (min) 40 min    Activity Tolerance Patient tolerated treatment well    Behavior During Therapy [facility] for tasks assessed/performed             Past Medical History:  Diagnosis Date   BPH (benign prostatic hyperplasia)    Difficult airway for intubation    per wife,during gallbladder sx   Hyperlipidemia    Vitamin D deficiency     Past Surgical History:  Procedure Laterality Date   CHOLECYSTECTOMY     HAND SURGERY Left     There were no vitals filed for this visit.   Subjective Assessment - 01/16/21 0904     Subjective Patient reports that his shoulder feels about the same today. He still has a dull pain when trying to reach behind his back.    Pertinent History HTN    Patient Stated Goals be able to reach behind him without pain    Currently in Pain? No/denies    Pain Onset More than a month ago                               [facility] Adult PT Treatment/Exercise - 01/16/21 0001       Shoulder Exercises: Sidelying   External Rotation Right;Strengthening;20 reps;Weights    External Rotation Weight (lbs) 4    Flexion Strengthening;Right;20 reps;Weights    Flexion Weight (lbs) 7    ABduction Strengthening;Right;15 reps;Weights    ABduction Weight (lbs) 7      Shoulder Exercises: Standing   External Rotation Right;Strengthening;15 reps;Weights   at 90 degrees shoulder abduction   External Rotation Weight (lbs) 7    Row Right;20 reps;Theraband    Theraband  Level (Shoulder Row) Level 3 (Green)    Other Standing Exercises Abduction wall taps   6 lb ball; 12 reps     Shoulder Exercises: ROM/Strengthening   UBE (Upper Arm Bike) 5/5 minutes (forward and backward)      Shoulder Exercises: Stretch   Internal Rotation Stretch 10 seconds    Internal Rotation Stretch Limitations 15 reps with arm behind back                          PT Long Term Goals - 12/24/20 1324       PT LONG TERM GOAL #1   Title Patient will be independent with his HEP.    Time 5    Period Weeks    Status New    Target Date 01/28/21      PT LONG TERM GOAL #2   Title Patient will be able to reach behind his back to the level of T8 for improved function washing his back with his RUE.    Time 5    Period Weeks    Status New    Target Date 01/28/21      PT  LONG TERM GOAL #3   Title Patient will be able to demonstrate at least 130 degrees of active right shoulder for improved function reaching overhead.    Time 5    Period Weeks    Status New    Target Date 01/28/21                   Plan - 01/16/21 2426     Clinical Impression Statement Patient was progressed with multiple new interventions for improved shoulder strength. He required minimal cuing with today's new interventions for proper biomechanics to facilitate proper rotator cuff engagement. He exhibited low shoulder pain and irritability with today's interventions. He reported that his shoulder felt a little tired upon the conclusion of treatment. He continues to require skilled physical therapy to address his remaining impairments to return to his prior level of function.    Personal Factors and Comorbidities Comorbidity 1    Comorbidities HTN    Examination-Activity Limitations Bathing    Examination-Participation Restrictions Other    Stability/Clinical Decision Making Evolving/Moderate complexity    Rehab Potential Good    PT Frequency 2x / week    PT Duration Other (comment)    5 weeks   PT Treatment/Interventions Neuromuscular re-education;Therapeutic exercise;Therapeutic activities;Manual techniques;Electrical Stimulation;Moist Heat;Ultrasound;Patient/family education;Taping;Vasopneumatic Device    PT Next Visit Plan UBE, internal rotation stretch, upper extremity strengthening    Consulted and Agree with Plan of Care Patient             Patient will benefit from skilled therapeutic intervention in order to improve the following deficits and impairments:  Decreased range of motion, Impaired UE functional use, Pain, Hypomobility, Decreased strength  Visit Diagnosis: Stiffness of right shoulder, not elsewhere classified  Acute pain of right shoulder     Problem List Patient Active Problem List   Diagnosis Date Noted   Hypertension 05/18/2020   Dupuytren contracture 06/21/2018   Abnormal CT scan of lung 10/11/2016   Aortic atherosclerosis (HCC) 11/16/2015   Solitary pulmonary nodule 06/20/2015   Hyperlipidemia 08/16/2012   Benign prostatic hyperplasia 08/16/2012   Vitamin D deficiency    Difficult airway for intubation     Manuel Kidd, PT 01/16/2021, 12:45 PM  [facility] [address] [place], [place], [address] Phone: [phone]   Fax:  [phone]  Name: Manuel Kidd MRN: [id number] Date of Birth: 1950-05-01

## 2021-01-18 ENCOUNTER — Encounter: Payer: Self-pay | Admitting: Internal Medicine

## 2021-01-18 ENCOUNTER — Ambulatory Visit: Payer: Medicare PPO | Admitting: Internal Medicine

## 2021-01-18 VITALS — BP 132/72 | HR 84 | Ht 74.0 in | Wt 193.0 lb

## 2021-01-18 DIAGNOSIS — R140 Abdominal distension (gaseous): Secondary | ICD-10-CM | POA: Diagnosis not present

## 2021-01-18 DIAGNOSIS — Z1211 Encounter for screening for malignant neoplasm of colon: Secondary | ICD-10-CM

## 2021-01-18 DIAGNOSIS — R143 Flatulence: Secondary | ICD-10-CM

## 2021-01-18 MED ORDER — SUTAB 1479-225-188 MG PO TABS: ORAL_TABLET | ORAL | 0 refills | Status: DC

## 2021-01-18 NOTE — Patient Instructions (Addendum)
You have been scheduled for a colonoscopy. Please follow written instructions given to you at your visit today.  °Please pick up your prep supplies at the pharmacy within the next 1-3 days. °If you use inhalers (even only as needed), please bring them with you on the day of your procedure. °____________________________________________________ ° °You have been given a testing kit to check for small intestine bacterial overgrowth (SIBO) which is completed by a company named [employer]. Make sure to return your test in the mail using the return mailing label given to you along with the kit. Your demographic and insurance information have already been sent to the company and they should be in contact with you over the next week regarding this test. [facility] will collect an upfront charge of $99.74 for commercial insurance plans and $209.74 is you are paying cash. Make sure to discuss with [facility] PRIOR to having the test if they have gotten informatoin from your insurance company as to how much your testing will cost out of pocket, if any. Please keep in mind that you will be getting a call from phone number [phone] or a similar number. If you do not hear from them within this time frame, please call our office at [phone].  °_______________________________________________________ ° °If you are age 65 or older, your body mass index should be between 23-30. Your Body mass index is 24.78 kg/m². If this is out of the aforementioned range listed, please consider follow up with your Primary Care Provider. ° °If you are age 64 or younger, your body mass index should be between 19-25. Your Body mass index is 24.78 kg/m². If this is out of the aformentioned range listed, please consider follow up with your Primary Care Provider.  ° °________________________________________________________ ° °The [facility] providers would like to encourage you to use MYCHART to communicate with providers for  non-urgent requests or questions.  Due to long hold times on the telephone, sending your provider a message by MYCHART may be a faster and more efficient way to get a response.  Please allow 48 business hours for a response.  Please remember that this is for non-urgent requests.  °_______________________________________________________ ° °Due to recent changes in healthcare laws, you may see the results of your imaging and laboratory studies on MyChart before your provider has had a chance to review them.  We understand that in some cases there may be results that are confusing or concerning to you. Not all laboratory results come back in the same time frame and the provider may be waiting for multiple results in order to interpret others.  Please give us 48 hours in order for your provider to thoroughly review all the results before contacting the office for clarification of your results.  ° °

## 2021-01-18 NOTE — Progress Notes (Signed)
Patient ID: Manuel Kidd, male   DOB: 14-Jul-1950, 71 y.o.   MRN: [id number] HPI: Manuel Kidd is a 71 year old male with a history of gallbladder disease status postcholecystectomy, BPH, hyperlipidemia, low vitamin D who is seen to evaluate excessive flatulence and occasional bloating symptom.  He is here today with his wife.  His GI history is minimal but includes a colonoscopy which was performed on 08/29/2011 by Dr. Juanda Chance.  This colonoscopy was normal to the cecum.  He reports that he has excessive gas and flatulence.  This is not necessarily a new symptom for him and has been going on for years.  He will feel minor bloating symptom.  He has tried Gas-X, digestive enzymes, yogurt probiotic and following a lactose-free diet.  None of this has been helpful.  Gas is not necessarily more noticeable after a meal.  He does not have abdominal pain.  His bowel movements are regular usually once or twice per day.  He does not have blood in stool or melena.  He denies upper GI symptoms including heartburn, dysphagia and odynophagia.  No nausea or vomiting.  Good appetite and stable weight.  He does drink 3 carbonated beverages per day either diet [facility] or Coke 0.  He is not using mints or candies.  Reported family history of colon polyps but not of colon cancer.  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  Past Medical History:  Diagnosis Date   BPH (benign prostatic hyperplasia)    Difficult airway for intubation    per wife,during gallbladder sx   Hyperlipidemia    Vitamin D deficiency     Past Surgical History:  Procedure Laterality Date   CHOLECYSTECTOMY     HAND SURGERY Left     Outpatient Medications Prior to Visit  Medication Sig Dispense Refill   cycloSPORINE (RESTASIS) 0.05 % ophthalmic emulsion Place 1 drop into both eyes 2 (two) times daily.     meloxicam (MOBIC) 15 MG tablet Take 1 tablet (15 mg total) by mouth daily. 30 tablet 3   Multiple  Vitamin (MULTIVITAMIN) tablet Take 1 tablet by mouth daily.     Omega-3 Fatty Acids (FISH OIL) 1000 MG CAPS Take 1 capsule by mouth daily. 5 days a week     prednisoLONE acetate (PRED MILD) 0.12 % ophthalmic suspension Place 1 drop into both eyes as needed.     valACYclovir (VALTREX) 1000 MG tablet Take 1 tablet (1,000 mg total) by mouth 2 (two) times daily. 20 tablet 0   VITAMIN D, ERGOCALCIFEROL, PO Take 2,000 Units by mouth daily. 5 days a week     methylPREDNISolone (MEDROL) 4 MG tablet Medrol dose pack. Take as instructed (Patient not taking: Reported on 12/24/2020) 21 tablet 0   No facility-administered medications prior to visit.    No Known Allergies  Family History  Problem Relation Age of Onset   Colon polyps Mother    Heart failure Mother        died age 19   Colon polyps Father    CAD Brother 3       MIs   Colon cancer Neg Hx     Social History   Tobacco Use   Smoking status: Never   Smokeless tobacco: Never  Vaping Use   Vaping Use: Never used  Substance Use Topics   Alcohol use: No   Drug use: No    ROS: As per history of present illness, otherwise negative  BP 132/72    Pulse 84    Ht 6\' 2"  (1.88 m)    Wt 193 lb (87.5 kg)    BMI 24.78 kg/m  Gen: awake, alert, NAD HEENT: anicteric  CV: RRR, no mrg Pulm: CTA b/l Abd: soft, NT/ND, +BS throughout Ext: no c/c/e Neuro: nonfocal   RELEVANT LABS AND IMAGING: CBC    Component Value Date/Time   WBC 4.6 11/07/2020 0924   WBC 5.4 05/27/2014 0921   RBC 5.60 11/07/2020 0924   RBC 5.80 05/27/2014 0921   HGB 16.0 11/07/2020 0924   HCT 46.9 11/07/2020 0924   PLT 165 11/07/2020 0924   MCV 84 11/07/2020 0924   MCH 28.6 11/07/2020 0924   MCH 27.7 05/27/2014 0921   MCHC 34.1 11/07/2020 0924   MCHC 32.8 05/27/2014 0921   RDW 11.9 11/07/2020 0924   LYMPHSABS 1.0 11/07/2020 0924   EOSABS 0.3 11/07/2020 0924   BASOSABS 0.1 11/07/2020 0924    CMP     Component Value Date/Time   NA 140 11/07/2020 0924    K 4.7 11/07/2020 0924   CL 102 11/07/2020 0924   CO2 27 11/07/2020 0924   GLUCOSE 85 11/07/2020 0924   GLUCOSE 90 04/07/2012 0848   BUN 15 11/07/2020 0924   CREATININE 0.91 11/07/2020 0924   CREATININE 0.88 04/07/2012 0848   CALCIUM 9.7 11/07/2020 0924   PROT 7.1 11/07/2020 0924   ALBUMIN 4.9 (H) 11/07/2020 0924   AST 26 11/07/2020 0924   ALT 14 11/07/2020 0924   ALKPHOS 64 11/07/2020 0924   BILITOT 0.9 11/07/2020 0924   GFRNONAA 88 11/11/2019 0857   GFRNONAA >89 04/07/2012 0848   GFRAA 102 11/11/2019 0857   GFRAA >89 04/07/2012 0848    ASSESSMENT/PLAN: 71 year old male with a history of gallbladder disease status postcholecystectomy, BPH, hyperlipidemia, low vitamin D who is seen to evaluate excessive flatulence and occasional bloating symptom.   Flatulence and gas/mild abdominal bloating --this may be dietary related to his carbonated beverages which he typically drinks 36 ounces per day.  He is not using sorbitol or other sugar-free products.  We also discussed the possibility of SIBO.  He is tried lactose-free diet without change in flatulence.  Also no great help by simethicone or probiotic. --SIBO breath test --If negative eliminate carbonated beverages  2.  Colon cancer screening --his last colonoscopy was 10 years ago.  He does have history of difficult airway noted by anesthesia during cholecystectomy.  I recommended screening colonoscopy at this time.  We reviewed the risk, benefits and alternatives and he is agreeable and wishes to proceed.  We will do so at [facility] with monitored anesthesia care --Screening colonoscopy in the outpatient hospital setting      [facility], [id number], [facility] [address] [place],  [place]

## 2021-01-21 ENCOUNTER — Ambulatory Visit: Payer: Medicare PPO

## 2021-01-21 ENCOUNTER — Other Ambulatory Visit: Payer: Self-pay

## 2021-01-21 DIAGNOSIS — M25611 Stiffness of right shoulder, not elsewhere classified: Secondary | ICD-10-CM | POA: Diagnosis not present

## 2021-01-21 DIAGNOSIS — M25511 Pain in right shoulder: Secondary | ICD-10-CM | POA: Diagnosis not present

## 2021-01-21 NOTE — Therapy (Signed)
[facility] [address] [place], [place], [address] Phone: [phone]   Fax:  [phone]  Physical Therapy Treatment  Patient Details  Name: Manuel Kidd MRN: [id number] Date of Birth: [birth year] Referring Provider (PT): Magnus Ivan   Encounter Date: 01/21/2021   PT End of Session - 01/21/21 0814     Visit Number 4    Number of Visits 10    Date for PT Re-Evaluation 03/22/21    PT Start Time 0815    PT Stop Time 0858    PT Time Calculation (min) 43 min    Activity Tolerance Patient tolerated treatment well    Behavior During Therapy [facility] for tasks assessed/performed             Past Medical History:  Diagnosis Date   BPH (benign prostatic hyperplasia)    Difficult airway for intubation    per wife,during gallbladder sx   Hyperlipidemia    Vitamin D deficiency     Past Surgical History:  Procedure Laterality Date   CHOLECYSTECTOMY     HAND SURGERY Left     There were no vitals filed for this visit.   Subjective Assessment - 01/21/21 0813     Subjective Patient reports that his shoulder feels about the same today. He notes that reaching behind him continues to hurt his shoulder.    Pertinent History HTN    Patient Stated Goals be able to reach behind him without pain    Currently in Pain? Yes    Pain Score 10-Worst pain ever   when he reaches behind his back   Pain Location Shoulder    Pain Orientation Right    Pain Onset More than a month ago                               [facility] Adult PT Treatment/Exercise - 01/21/21 0001       Elbow Exercises   Elbow Flexion Standing;Right;15 reps   7 pound weight; regular and hammer curl     Shoulder Exercises: Standing   Horizontal ABduction Both;15 reps;Theraband   2 sets   Theraband Level (Shoulder Horizontal ABduction) Level 3 (Green)    External Rotation Both;15 reps;Theraband   2 sets   Theraband Level (Shoulder External Rotation) Level 3 (Green)     Extension Right;20 reps;Weights   bent over   Extension Weight (lbs) 9    Row Right;20 reps;Weights   bent over row   Row Weight (lbs) 9    Diagonals Both;20 reps;Theraband    Theraband Level (Shoulder Diagonals) Level 3 (Green)    Other Standing Exercises Internal rotation lift off   20 reps     Shoulder Exercises: ROM/Strengthening   UBE (Upper Arm Bike) 5/5 minutes (forward and backward)    Wall Pushups 15 reps   2 sets     Shoulder Exercises: Stretch   Corner Stretch 4 reps;30 seconds    Internal Rotation Stretch 10 seconds   cues for a 30 second hold   Other Shoulder Stretches Doorway stretch   4 reps x 30 second hold                         PT Long Term Goals - 12/24/20 5009       PT LONG TERM GOAL #1   Title Patient will be independent with his HEP.    Time 5  Period Weeks    Status New    Target Date 01/28/21      PT LONG TERM GOAL #2   Title Patient will be able to reach behind his back to the level of T8 for improved function washing his back with his RUE.    Time 5    Period Weeks    Status New    Target Date 01/28/21      PT LONG TERM GOAL #3   Title Patient will be able to demonstrate at least 130 degrees of active right shoulder for improved function reaching overhead.    Time 5    Period Weeks    Status New    Target Date 01/28/21                   Plan - 01/21/21 0815     Clinical Impression Statement Patient was introduced to multiple new interventions for improved shoulder strength and mobility. He required minimal to moderate cuing with a standing internal rotation stretch and today's new interventions for a prolonged hold for imprfoved soft tissue extensibility and improved eccentric control. respectively. He reported that his shoulder felt "about the same" upon the conclusion of treatment. He continues to require skilled physical therapy to address his remaining impairments to return to his prior level of function.     Personal Factors and Comorbidities Comorbidity 1    Comorbidities HTN    Examination-Activity Limitations Bathing    Examination-Participation Restrictions Other    Stability/Clinical Decision Making Evolving/Moderate complexity    Rehab Potential Good    PT Frequency 2x / week    PT Duration Other (comment)   5 weeks   PT Treatment/Interventions Neuromuscular re-education;Therapeutic exercise;Therapeutic activities;Manual techniques;Electrical Stimulation;Moist Heat;Ultrasound;Patient/family education;Taping;Vasopneumatic Device    PT Next Visit Plan UBE, internal rotation stretch, upper extremity strengthening    Consulted and Agree with Plan of Care Patient             Patient will benefit from skilled therapeutic intervention in order to improve the following deficits and impairments:  Decreased range of motion, Impaired UE functional use, Pain, Hypomobility, Decreased strength  Visit Diagnosis: Stiffness of right shoulder, not elsewhere classified  Acute pain of right shoulder     Problem List Patient Active Problem List   Diagnosis Date Noted   Hypertension 05/18/2020   Dupuytren contracture 06/21/2018   Abnormal CT scan of lung 10/11/2016   Aortic atherosclerosis (HCC) 11/16/2015   Solitary pulmonary nodule 06/20/2015   Hyperlipidemia 08/16/2012   Benign prostatic hyperplasia 08/16/2012   Vitamin D deficiency    Difficult airway for intubation     Granville Lewis, PT 01/21/2021, 12:38 PM  [facility] [address] [place], [place], [address] Phone: [phone]   Fax:  [phone]  Name: Manuel Kidd MRN: [id number] Date of Birth: 02/03/1950

## 2021-01-22 DIAGNOSIS — H02889 Meibomian gland dysfunction of unspecified eye, unspecified eyelid: Secondary | ICD-10-CM | POA: Diagnosis not present

## 2021-01-22 DIAGNOSIS — H04123 Dry eye syndrome of bilateral lacrimal glands: Secondary | ICD-10-CM | POA: Diagnosis not present

## 2021-01-24 ENCOUNTER — Other Ambulatory Visit: Payer: Self-pay

## 2021-01-24 ENCOUNTER — Ambulatory Visit: Payer: Medicare PPO

## 2021-01-24 DIAGNOSIS — M25511 Pain in right shoulder: Secondary | ICD-10-CM | POA: Diagnosis not present

## 2021-01-24 DIAGNOSIS — M25611 Stiffness of right shoulder, not elsewhere classified: Secondary | ICD-10-CM

## 2021-01-24 NOTE — Therapy (Signed)
[facility] [address] [place], [place], [address] Phone: [phone]   Fax:  [phone]  Physical Therapy Treatment  Patient Details  Name: Manuel Kidd MRN: [id number] Date of Birth: Sep 04, 1950 Referring Provider (PT): Magnus Ivan   Encounter Date: 01/24/2021   PT End of Session - 01/24/21 0817     Visit Number 5    Number of Visits 10    Date for PT Re-Evaluation 03/22/21    PT Start Time 0815    PT Stop Time 0900    PT Time Calculation (min) 45 min    Activity Tolerance Patient tolerated treatment well    Behavior During Therapy [facility] for tasks assessed/performed             Past Medical History:  Diagnosis Date   BPH (benign prostatic hyperplasia)    Difficult airway for intubation    per wife,during gallbladder sx   Hyperlipidemia    Vitamin D deficiency     Past Surgical History:  Procedure Laterality Date   CHOLECYSTECTOMY     HAND SURGERY Left     There were no vitals filed for this visit.   Subjective Assessment - 01/24/21 0816     Subjective Patient reports that his shoulder feels about the same today. He still has no pain with normal activities unless he reaches behind his back.    Pertinent History HTN    Patient Stated Goals be able to reach behind him without pain    Currently in Pain? No/denies    Pain Onset More than a month ago                               [facility] Adult PT Treatment/Exercise - 01/24/21 0001       Shoulder Exercises: Standing   Horizontal ABduction Right;15 reps   2 sets   Horizontal ABduction Weight (lbs) [employer]    External Rotation Right;15 reps;Theraband   2 sets; with slight shoulder abduction   Flexion Right;20 reps;Weights   with horizontal abduction and eccentric lower in abduction   Shoulder Flexion Weight (lbs) 5    Extension Both;15 reps   2 sets   Extension Weight (lbs) Blue XTS    Diagonals Both;20 reps;Theraband    Theraband Level  (Shoulder Diagonals) Level 3 (Green)      Manual Therapy   Manual Therapy Joint mobilization;Soft tissue mobilization;Passive ROM    Joint Mobilization long axis glenohumeral, posterior, and inferior grade I-III    Soft tissue mobilization Right bicep    Passive ROM focus on IR/ER at 90 degrees abduction to tolerance                          PT Long Term Goals - 12/24/20 3094       PT LONG TERM GOAL #1   Title Patient will be independent with his HEP.    Time 5    Period Weeks    Status New    Target Date 01/28/21      PT LONG TERM GOAL #2   Title Patient will be able to reach behind his back to the level of T8 for improved function washing his back with his RUE.    Time 5    Period Weeks    Status New    Target Date 01/28/21      PT LONG TERM GOAL #3  Title Patient will be able to demonstrate at least 130 degrees of active right shoulder for improved function reaching overhead.    Time 5    Period Weeks    Status New    Target Date 01/28/21                   Plan - 01/24/21 [id number]     Clinical Impression Statement Patient was progressed with multiple familiar interventions for improved shoulder strength and stability. He required minimal cuing with resisted shoulder external rotation for slight shoulder abduction for shoulder stability. However, this slowly began to increase his familiar shoulder pain. He reported that his shoulder felt significantly better after manual therapy at the conclusion of treatment. Manual therapy focused on glenohumaral joint mobilizations were the most effective. He continues to require skilled physical therapy to address his remaining impairments to return to his prior level of function.    Personal Factors and Comorbidities Comorbidity 1    Comorbidities HTN    Examination-Activity Limitations Bathing    Examination-Participation Restrictions Other    Stability/Clinical Decision Making Evolving/Moderate complexity     Rehab Potential Good    PT Frequency 2x / week    PT Duration Other (comment)   5 weeks   PT Treatment/Interventions Neuromuscular re-education;Therapeutic exercise;Therapeutic activities;Manual techniques;Electrical Stimulation;Moist Heat;Ultrasound;Patient/family education;Taping;Vasopneumatic Device    PT Next Visit Plan UBE, internal rotation stretch, upper extremity strengthening; MT at the beginning of treatment    Consulted and Agree with Plan of Care Patient             Patient will benefit from skilled therapeutic intervention in order to improve the following deficits and impairments:  Decreased range of motion, Impaired UE functional use, Pain, Hypomobility, Decreased strength  Visit Diagnosis: Stiffness of right shoulder, not elsewhere classified  Acute pain of right shoulder     Problem List Patient Active Problem List   Diagnosis Date Noted   Hypertension 05/18/2020   Dupuytren contracture 06/21/2018   Abnormal CT scan of lung 10/11/2016   Aortic atherosclerosis ([place]) 11/16/2015   Solitary pulmonary nodule 06/20/2015   Hyperlipidemia 08/16/2012   Benign prostatic hyperplasia 08/16/2012   Vitamin D deficiency    Difficult airway for intubation     Darlin Coco, PT 01/24/2021, 3:13 PM  [facility] [address] [place], [place], [address] Phone: [phone]   Fax:  [phone]  Name: Jaqueline Hassebrock MRN: [id number] Date of Birth: 02/25/1950

## 2021-01-28 ENCOUNTER — Ambulatory Visit: Payer: Medicare PPO | Admitting: Orthopaedic Surgery

## 2021-01-28 ENCOUNTER — Ambulatory Visit: Payer: Medicare PPO

## 2021-01-31 ENCOUNTER — Other Ambulatory Visit: Payer: Self-pay

## 2021-01-31 ENCOUNTER — Ambulatory Visit: Payer: Medicare PPO

## 2021-01-31 DIAGNOSIS — M25511 Pain in right shoulder: Secondary | ICD-10-CM

## 2021-01-31 DIAGNOSIS — M25611 Stiffness of right shoulder, not elsewhere classified: Secondary | ICD-10-CM

## 2021-01-31 NOTE — Therapy (Signed)
[facility] [place], [place], [address] Phone: [phone]   Fax:  [phone]  Physical Therapy Treatment  Patient Details  Name: Manuel Kidd MRN: [id number] Date of Birth: December 16, 1950 Referring Provider (PT): Ninfa Linden   Encounter Date: 01/31/2021   PT End of Session - 01/31/21 0927     Visit Number 6    Number of Visits 10    Date for PT Re-Evaluation 03/22/21    PT Start Time 0902    PT Stop Time 0945    PT Time Calculation (min) 43 min    Activity Tolerance Patient tolerated treatment well    Behavior During Therapy [facility] for tasks assessed/performed             Past Medical History:  Diagnosis Date   BPH (benign prostatic hyperplasia)    Difficult airway for intubation    per wife,during gallbladder sx   Hyperlipidemia    Vitamin D deficiency     Past Surgical History:  Procedure Laterality Date   CHOLECYSTECTOMY     HAND SURGERY Left     There were no vitals filed for this visit.   Subjective Assessment - 01/31/21 0905     Subjective Patient reports that his shoulder still feels about the same today.    Pertinent History HTN    Patient Stated Goals be able to reach behind him without pain    Currently in Pain? No/denies    Pain Onset More than a month ago                               [facility] Adult PT Treatment/Exercise - 01/31/21 0001       Shoulder Exercises: Standing   Other Standing Exercises PNF D1 and D2   2x20; blue XTS     Shoulder Exercises: ROM/Strengthening   UBE (Upper Arm Bike) 5/5 minutes (forward and backward)   with RUE only     Manual Therapy   Manual Therapy Joint mobilization;Soft tissue mobilization;Passive ROM    Joint Mobilization long axis lateral distraction along with glenohumeral posterior, and inferior grade I-IV    Soft tissue mobilization Right bicep and triceps    Passive ROM focus on IR/ER at 90 degrees abduction to tolerance                           PT Long Term Goals - 12/24/20 [id number]       PT LONG TERM GOAL #1   Title Patient will be independent with his HEP.    Time 5    Period Weeks    Status New    Target Date 01/28/21      PT LONG TERM GOAL #2   Title Patient will be able to reach behind his back to the level of T8 for improved function washing his back with his RUE.    Time 5    Period Weeks    Status New    Target Date 01/28/21      PT LONG TERM GOAL #3   Title Patient will be able to demonstrate at least 130 degrees of active right shoulder for improved function reaching overhead.    Time 5    Period Weeks    Status New    Target Date 01/28/21  Plan - 01/31/21 0927     Clinical Impression Statement Treatment was initiated with manual therapy in an attempt to reduce his pain with functional internal rotation. Soft tissue mobilization to the biceps tendon and triceps were the most effective, but it was only able to minimally reduce his familiar symptoms. This was followed by appropriately matched interventions for rotator cuff strengthening. He reported no pain or discomfort with any of today's interventions. He reported feeling about the same ion of treatment. He continues to require skilled physical therapy to address his remaining impairments to maximize his functional mobility.    Personal Factors and Comorbidities Comorbidity 1    Comorbidities HTN    Examination-Activity Limitations Bathing    Examination-Participation Restrictions Other    Stability/Clinical Decision Making Evolving/Moderate complexity    Rehab Potential Good    PT Frequency 2x / week    PT Duration Other (comment)   5 weeks   PT Treatment/Interventions Neuromuscular re-education;Therapeutic exercise;Therapeutic activities;Manual techniques;Electrical Stimulation;Moist Heat;Ultrasound;Patient/family education;Taping;Vasopneumatic Device    PT Next Visit Plan UBE, internal rotation  stretch, upper extremity strengthening; MT at the beginning of treatment    Consulted and Agree with Plan of Care Patient             Patient will benefit from skilled therapeutic intervention in order to improve the following deficits and impairments:  Decreased range of motion, Impaired UE functional use, Pain, Hypomobility, Decreased strength  Visit Diagnosis: Stiffness of right shoulder, not elsewhere classified  Acute pain of right shoulder     Problem List Patient Active Problem List   Diagnosis Date Noted   Hypertension 05/18/2020   Dupuytren contracture 06/21/2018   Abnormal CT scan of lung 10/11/2016   Aortic atherosclerosis ([place]) 11/16/2015   Solitary pulmonary nodule 06/20/2015   Hyperlipidemia 08/16/2012   Benign prostatic hyperplasia 08/16/2012   Vitamin D deficiency    Difficult airway for intubation     Darlin Coco, PT 01/31/2021, 11:28 AM  [facility] [address] [place], [place], [address] Phone: [phone]   Fax:  [phone]  Name: Manuel Kidd MRN: [id number] Date of Birth: 06/01/1950

## 2021-02-12 ENCOUNTER — Other Ambulatory Visit: Payer: Self-pay

## 2021-02-12 ENCOUNTER — Ambulatory Visit: Payer: Medicare PPO

## 2021-02-12 DIAGNOSIS — M25511 Pain in right shoulder: Secondary | ICD-10-CM

## 2021-02-12 DIAGNOSIS — M25611 Stiffness of right shoulder, not elsewhere classified: Secondary | ICD-10-CM

## 2021-02-12 NOTE — Therapy (Signed)
[facility] [place], [place], [address] Phone: [phone]   Fax:  [phone]  Physical Therapy Treatment  Patient Details  Name: Manuel Kidd MRN: [id number] Date of Birth: [birth year] Referring Provider (PT): Ninfa Linden   Encounter Date: 02/12/2021   PT End of Session - 02/12/21 1033     Visit Number 7    Number of Visits 10    Date for PT Re-Evaluation 03/22/21    PT Start Time 1030    PT Stop Time 1106    PT Time Calculation (min) 36 min    Activity Tolerance Patient tolerated treatment well    Behavior During Therapy [facility] for tasks assessed/performed             Past Medical History:  Diagnosis Date   BPH (benign prostatic hyperplasia)    Difficult airway for intubation    per wife,during gallbladder sx   Hyperlipidemia    Vitamin D deficiency     Past Surgical History:  Procedure Laterality Date   CHOLECYSTECTOMY     HAND SURGERY Left     There were no vitals filed for this visit.   Subjective Assessment - 02/12/21 1032     Subjective Patient reports that his shoulder has not changed any since his last appointment. He has  an appointment with Dr. Ninfa Linden on 02/20/21.    Pertinent History HTN    Patient Stated Goals be able to reach behind him without pain    Currently in Pain? No/denies    Pain Onset More than a month ago                [facility] PT Assessment - 02/12/21 0001       AROM   Right Shoulder Flexion 134 Degrees    Right Shoulder ABduction 115 Degrees    Right Shoulder Internal Rotation --   L4; "hurts"   Right Shoulder External Rotation --   T4                          [facility] Adult PT Treatment/Exercise - 02/12/21 0001       Shoulder Exercises: Supine   Flexion Both;20 reps;Theraband    Theraband Level (Shoulder Flexion) Level 3 (Green)    Diagonals Right;20 reps;Weights    Diagonals Weight (lbs) 7      Shoulder Exercises: ROM/Strengthening   UBE (Upper  Arm Bike) 5/5 minutes (forward and backward)      Manual Therapy   Manual Therapy Joint mobilization;Soft tissue mobilization;Passive ROM    Joint Mobilization long axis lateral distraction along with glenohumeral posterior, and inferior grade I-IV    Soft tissue mobilization Right bicep and triceps    Passive ROM focus on IR/ER at 90 degrees abduction to tolerance                          PT Long Term Goals - 02/12/21 1104       PT LONG TERM GOAL #1   Title Patient will be independent with his HEP.    Time 5    Period Weeks    Status Achieved    Target Date 01/28/21      PT LONG TERM GOAL #2   Title Patient will be able to reach behind his back to the level of T8 for improved function washing his back with his RUE.    Time 5    Period  Weeks    Status Not Met    Target Date 01/28/21      PT LONG TERM GOAL #3   Title Patient will be able to demonstrate at least 130 degrees of active right shoulder for improved function reaching overhead.    Time 5    Period Weeks    Status Achieved    Target Date 01/28/21                   Plan - 02/12/21 1041     Clinical Impression Statement Patient presented to treatment with no reported change in his symptoms. Manual therapy was able to temporarily improve his PROM and slightly reduce his soreness. However, this did not translate into improvements in his AROM or reducing his pain with functional internal rotation. He reported feeling comfortable being discharged at this time as he has a follow up with another physician on 02/20/21 for further evaluation of his right shoulder pain. He reported feeling comfortable with his current HEP.    Personal Factors and Comorbidities Comorbidity 1    Comorbidities HTN    Examination-Activity Limitations Bathing    Examination-Participation Restrictions Other    Stability/Clinical Decision Making Evolving/Moderate complexity    Rehab Potential Good    PT Frequency 2x / week     PT Duration Other (comment)   5 weeks   PT Treatment/Interventions Neuromuscular re-education;Therapeutic exercise;Therapeutic activities;Manual techniques;Electrical Stimulation;Moist Heat;Ultrasound;Patient/family education;Taping;Vasopneumatic Device    PT Next Visit Plan UBE, internal rotation stretch, upper extremity strengthening; MT at the beginning of treatment    Consulted and Agree with Plan of Care Patient             Patient will benefit from skilled therapeutic intervention in order to improve the following deficits and impairments:  Decreased range of motion, Impaired UE functional use, Pain, Hypomobility, Decreased strength  Visit Diagnosis: Stiffness of right shoulder, not elsewhere classified  Acute pain of right shoulder     Problem List Patient Active Problem List   Diagnosis Date Noted   Hypertension 05/18/2020   Dupuytren contracture 06/21/2018   Abnormal CT scan of lung 10/11/2016   Aortic atherosclerosis ([place]) 11/16/2015   Solitary pulmonary nodule 06/20/2015   Hyperlipidemia 08/16/2012   Benign prostatic hyperplasia 08/16/2012   Vitamin D deficiency    Difficult airway for intubation     Darlin Coco, PT 02/12/2021, 12:14 PM  [facility] [address] [place], [place], [address] Phone: [phone]   Fax:  [phone]  Name: Manuel Kidd MRN: [id number] Date of Birth: 1950-09-30  PHYSICAL THERAPY DISCHARGE SUMMARY  Visits from Start of Care: 7  Current functional level related to goals / functional outcomes: Patient has made no significant improvements with physical therapy.    Remaining deficits: Pain with functional internal rotation    Education / Equipment: HEP    Patient agrees to discharge. Patient goals were partially met. Patient is being discharged due to lack of progress.

## 2021-02-19 DIAGNOSIS — R140 Abdominal distension (gaseous): Secondary | ICD-10-CM | POA: Diagnosis not present

## 2021-02-20 ENCOUNTER — Other Ambulatory Visit: Payer: Self-pay

## 2021-02-20 ENCOUNTER — Encounter: Payer: Self-pay | Admitting: Orthopaedic Surgery

## 2021-02-20 ENCOUNTER — Ambulatory Visit: Payer: Medicare PPO | Admitting: Orthopaedic Surgery

## 2021-02-20 DIAGNOSIS — M7541 Impingement syndrome of right shoulder: Secondary | ICD-10-CM | POA: Diagnosis not present

## 2021-02-20 DIAGNOSIS — M25511 Pain in right shoulder: Secondary | ICD-10-CM

## 2021-02-20 NOTE — Progress Notes (Signed)
The patient comes in today for continued follow-up as it relates to his right shoulder significant impingement.  He is 71 years old and very active.  He still has a hard time reaching behind him and overhead with lacking full range of motion.  He has been through numerous physical therapy sessions.  We have worked on activity modification and anti-inflammatories.  He has had steroid injections around the shoulder as well on the right side.  On my exam his internal rotation with adduction is still limited.  His abduction and forward flexion of the right shoulder is also still limited.  He does exhibit some weakness in the shoulder as well.  At this point given the failure of conservative treatment including all those things just mentioned in the above note, a MRI is warranted of the right shoulder to assess the rotator cuff and for any other pathology that is causing him his shoulder pain and weakness as well as decreased motion.  We will see him back after this MRI.  He agrees with this treatment plan.

## 2021-02-27 ENCOUNTER — Other Ambulatory Visit: Payer: Self-pay

## 2021-02-27 ENCOUNTER — Telehealth: Payer: Self-pay

## 2021-02-27 MED ORDER — AMOXICILLIN-POT CLAVULANATE 875-125 MG PO TABS: 1.0000 | ORAL_TABLET | Freq: Two times a day (BID) | ORAL | 0 refills | Status: DC

## 2021-02-27 MED ORDER — METRONIDAZOLE 500 MG PO TABS: 500.0000 mg | ORAL_TABLET | Freq: Two times a day (BID) | ORAL | 0 refills | Status: DC

## 2021-02-27 NOTE — Telephone Encounter (Signed)
Per Dr. Rhea Belton pt breath test + for SIBO. Pt aware and scripts for augmentin and flagyl sent to the pharmacy, pt aware.

## 2021-03-09 ENCOUNTER — Other Ambulatory Visit: Payer: Self-pay

## 2021-03-09 ENCOUNTER — Ambulatory Visit
Admission: RE | Admit: 2021-03-09 | Discharge: 2021-03-09 | Disposition: A | Discharge status: Home / Self Care | Payer: Medicare PPO | Source: Ambulatory Visit | Attending: Orthopaedic Surgery | Admitting: Orthopaedic Surgery

## 2021-03-09 DIAGNOSIS — S43431A Superior glenoid labrum lesion of right shoulder, initial encounter: Secondary | ICD-10-CM | POA: Diagnosis not present

## 2021-03-09 DIAGNOSIS — M7541 Impingement syndrome of right shoulder: Secondary | ICD-10-CM

## 2021-03-09 DIAGNOSIS — M25511 Pain in right shoulder: Secondary | ICD-10-CM

## 2021-03-13 ENCOUNTER — Other Ambulatory Visit: Payer: Self-pay

## 2021-03-13 ENCOUNTER — Encounter: Payer: Self-pay | Admitting: Orthopaedic Surgery

## 2021-03-13 ENCOUNTER — Ambulatory Visit: Payer: Medicare PPO | Admitting: Orthopaedic Surgery

## 2021-03-13 DIAGNOSIS — M25511 Pain in right shoulder: Secondary | ICD-10-CM

## 2021-03-13 DIAGNOSIS — M7541 Impingement syndrome of right shoulder: Secondary | ICD-10-CM | POA: Diagnosis not present

## 2021-03-13 NOTE — Progress Notes (Signed)
The patient comes in today to go over MRI of his right shoulder.  He is a very active 71 year old gentleman and has failed conservative treatment for shoulder impingement.  There is still no weakness in the rotator cuff on my exam today.  Most of his pain is at the [facility] joint. ? ?The MRI does show AC joint arthritis which is moderate.  There is only mild tendinosis of the rotator cuff.  There is a small superior labral tearing of the biceps attachment but it is small.  Glenohumeral joint looks good.  The rotator cuff itself is intact with no muscle atrophy. ? ?I would like to send him to Dr. Alvester Morin for a one-time image guided injection of a steroid in the patient's right shoulder AC joint.  I will then see him back in follow-up to see if this is helped.  Another option at some point would be arthroscopic surgery.  Both the patient and I agree that conservative treatment for now is more appropriate to try. ?

## 2021-03-27 ENCOUNTER — Encounter (HOSPITAL_COMMUNITY): Payer: Self-pay | Admitting: Internal Medicine

## 2021-04-04 ENCOUNTER — Ambulatory Visit (HOSPITAL_COMMUNITY): Admit: 2021-04-04 | Payer: Medicare PPO | Admitting: Internal Medicine

## 2021-04-04 SURGERY — COLONOSCOPY WITH PROPOFOL
Anesthesia: Monitor Anesthesia Care

## 2021-04-08 ENCOUNTER — Ambulatory Visit: Payer: Medicare PPO | Admitting: Physical Medicine and Rehabilitation

## 2021-04-08 ENCOUNTER — Other Ambulatory Visit: Payer: Self-pay

## 2021-04-08 ENCOUNTER — Ambulatory Visit: Payer: Self-pay

## 2021-04-08 ENCOUNTER — Encounter: Payer: Self-pay | Admitting: Physical Medicine and Rehabilitation

## 2021-04-08 DIAGNOSIS — G8929 Other chronic pain: Secondary | ICD-10-CM

## 2021-04-08 DIAGNOSIS — M25511 Pain in right shoulder: Secondary | ICD-10-CM | POA: Diagnosis not present

## 2021-04-08 MED ORDER — TRIAMCINOLONE ACETONIDE 40 MG/ML IJ SUSP
40.0000 mg | INTRAMUSCULAR | Status: AC | PRN
  Administered 2021-04-08: 40 mg via INTRA_ARTICULAR

## 2021-04-08 MED ORDER — BUPIVACAINE HCL 0.25 % IJ SOLN
5.0000 mL | INTRAMUSCULAR | Status: AC | PRN
  Administered 2021-04-08: 5 mL via INTRA_ARTICULAR

## 2021-04-08 NOTE — Progress Notes (Signed)
? ?  Manuel Kidd - 70 y.o. male MRN [id number]  Date of birth: February 22, 1950 ? ?Office Visit Note: ?Visit Date: 04/08/2021 ?PCP: Dettinger, Elige Radon, MD ?Referred by: Dettinger, Elige Radon, MD ? ?Subjective: ?Chief Complaint  ?Patient presents with  ? Right Shoulder - Pain  ? ?HPI:  Manuel Kidd is a 71 y.o. male who comes in today at the request of Dr. Doneen Poisson for planned Right anesthetic glenohumeral arthrogram with fluoroscopic guidance.  The patient has failed conservative care including home exercise, medications, time and activity modification.  This injection will be diagnostic and hopefully therapeutic.  Please see requesting physician notes for further details and justification. ? ? ?ROS Otherwise per HPI. ? ?Assessment & Plan: ?Visit Diagnoses:  ?  ICD-10-CM   ?1. Chronic right shoulder pain  M25.511 XR C-ARM NO REPORT  ? G89.29 Large Joint Inj: R glenohumeral  ?  ?  ?Plan: No additional findings.  ? ?Meds & Orders: No orders of the defined types were placed in this encounter. ?  ?Orders Placed This Encounter  ?Procedures  ? Large Joint Inj: R glenohumeral  ? XR C-ARM NO REPORT  ?  ?Follow-up: Return for visit to requesting provider as needed.  ? ?Procedures: ?Large Joint Inj: R glenohumeral on 04/08/2021 10:00 AM ?Indications: pain and diagnostic evaluation ?Details: 22 G 3.5 in needle, fluoroscopy-guided anteromedial approach ? ?Arthrogram: No ? ?Medications: 40 mg triamcinolone acetonide 40 MG/ML; 5 mL bupivacaine 0.25 % ?Outcome: tolerated well, no immediate complications ? ?There was excellent flow of contrast producing a partial arthrogram of the glenohumeral joint. The patient did have relief of symptoms during the anesthetic phase of the injection. ?Procedure, treatment alternatives, risks and benefits explained, specific risks discussed. Consent was given by the patient. Immediately prior to procedure a time out was called to verify the correct patient, procedure, equipment, support staff and  site/side marked as required. Patient was prepped and draped in the usual sterile fashion.  ? ?  ?   ? ?Clinical History: ?No specialty comments available.  ? ? ? ?Objective:  VS:  HT:    WT:   BMI:     BP:   HR: bpm  TEMP: ( )  RESP:  ?Physical Exam  ? ?Imaging: ?XR C-ARM NO REPORT ? ?Result Date: 04/08/2021 ?Please see Notes tab for imaging impression.  ?

## 2021-04-08 NOTE — Progress Notes (Signed)
Pt state right shoulder pain. Pt state when reaching behind his back he feels pain. Pt state he doesn't take anything for the pain. ? ?Numeric Pain Rating Scale and Functional Assessment ?Average Pain 8 ? ? ?In the last MONTH (on 0-10 scale) has pain interfered with the following? ? ?1. General activity like being  able to carry out your everyday physical activities such as walking, climbing stairs, carrying groceries, or moving a chair?  ?Rating(9) ? ? ?+Driver, -BT, -Dye Allergies. ? ?

## 2021-04-24 ENCOUNTER — Encounter: Payer: Self-pay | Admitting: Orthopaedic Surgery

## 2021-04-24 ENCOUNTER — Ambulatory Visit: Payer: Medicare PPO | Admitting: Orthopaedic Surgery

## 2021-04-24 DIAGNOSIS — G8929 Other chronic pain: Secondary | ICD-10-CM | POA: Diagnosis not present

## 2021-04-24 DIAGNOSIS — M25511 Pain in right shoulder: Secondary | ICD-10-CM

## 2021-04-24 DIAGNOSIS — M7541 Impingement syndrome of right shoulder: Secondary | ICD-10-CM

## 2021-04-24 NOTE — Progress Notes (Signed)
The patient comes in today after having a steroid injection under radiographic guidance in his right shoulder by Dr. Alvester Morin.  He said he is about 75% better and overall is satisfied.  He is 71 years old.  He does report improved motion of his shoulder as well. ? ?He can reach behind himself and overhead much easier.  He is not in a lot of discomfort either.  His injection was 2 weeks ago. ? ?This point follow-up can be as needed.  I have recommended potentially repeat injection of the right shoulder in 3 months if needed.  He can call us first and we would then set him up for another injection by Dr. Alvester Morin in that right shoulder.  All question concerns were answered and addressed.  He agrees with this treatment plan. ?

## 2021-04-25 DIAGNOSIS — R399 Unspecified symptoms and signs involving the genitourinary system: Secondary | ICD-10-CM | POA: Diagnosis not present

## 2021-04-25 DIAGNOSIS — N401 Enlarged prostate with lower urinary tract symptoms: Secondary | ICD-10-CM | POA: Diagnosis not present

## 2021-05-11 ENCOUNTER — Encounter: Payer: Self-pay | Admitting: Family Medicine

## 2021-05-11 DIAGNOSIS — I10 Essential (primary) hypertension: Secondary | ICD-10-CM

## 2021-05-11 DIAGNOSIS — E782 Mixed hyperlipidemia: Secondary | ICD-10-CM

## 2021-05-15 NOTE — Progress Notes (Signed)
Placed future lab orders.

## 2021-05-16 ENCOUNTER — Other Ambulatory Visit: Payer: Medicare PPO

## 2021-05-16 DIAGNOSIS — E782 Mixed hyperlipidemia: Secondary | ICD-10-CM | POA: Diagnosis not present

## 2021-05-16 DIAGNOSIS — I10 Essential (primary) hypertension: Secondary | ICD-10-CM

## 2021-05-17 LAB — CBC WITH DIFFERENTIAL/PLATELET
Basophils Absolute: 0.1 10*3/uL (ref 0.0–0.2)
Basos: 2 %
EOS (ABSOLUTE): 0.3 10*3/uL (ref 0.0–0.4)
Eos: 8 %
Hematocrit: 45.7 % (ref 37.5–51.0)
Hemoglobin: 15.4 g/dL (ref 13.0–17.7)
Immature Grans (Abs): 0 10*3/uL (ref 0.0–0.1)
Immature Granulocytes: 0 %
Lymphocytes Absolute: 0.7 10*3/uL (ref 0.7–3.1)
Lymphs: 16 %
MCH: 28.7 pg (ref 26.6–33.0)
MCHC: 33.7 g/dL (ref 31.5–35.7)
MCV: 85 fL (ref 79–97)
Monocytes Absolute: 0.3 10*3/uL (ref 0.1–0.9)
Monocytes: 7 %
Neutrophils Absolute: 2.9 10*3/uL (ref 1.4–7.0)
Neutrophils: 67 %
Platelets: 168 10*3/uL (ref 150–450)
RBC: 5.37 x10E6/uL (ref 4.14–5.80)
RDW: 12.9 % (ref 11.6–15.4)
WBC: 4.3 10*3/uL (ref 3.4–10.8)

## 2021-05-17 LAB — CMP14+EGFR
ALT: 16 IU/L (ref 0–44)
AST: 22 IU/L (ref 0–40)
Albumin/Globulin Ratio: 2 (ref 1.2–2.2)
Albumin: 4.4 g/dL (ref 3.8–4.8)
Alkaline Phosphatase: 62 IU/L (ref 44–121)
BUN/Creatinine Ratio: 12 (ref 10–24)
BUN: 11 mg/dL (ref 8–27)
Bilirubin Total: 0.6 mg/dL (ref 0.0–1.2)
CO2: 24 mmol/L (ref 20–29)
Calcium: 9.5 mg/dL (ref 8.6–10.2)
Chloride: 103 mmol/L (ref 96–106)
Creatinine, Ser: 0.95 mg/dL (ref 0.76–1.27)
Globulin, Total: 2.2 g/dL (ref 1.5–4.5)
Glucose: 94 mg/dL (ref 70–99)
Potassium: 4 mmol/L (ref 3.5–5.2)
Sodium: 141 mmol/L (ref 134–144)
Total Protein: 6.6 g/dL (ref 6.0–8.5)
eGFR: 86 mL/min/{1.73_m2} (ref >59)

## 2021-05-17 LAB — LIPID PANEL
Chol/HDL Ratio: 3.9 ratio (ref 0.0–5.0)
Cholesterol, Total: 185 mg/dL (ref 100–199)
HDL: 48 mg/dL (ref >39)
LDL Chol Calc (NIH): 125 mg/dL — ABNORMAL HIGH (ref 0–99)
Triglycerides: 61 mg/dL (ref 0–149)
VLDL Cholesterol Cal: 12 mg/dL (ref 5–40)

## 2021-05-22 ENCOUNTER — Ambulatory Visit (INDEPENDENT_AMBULATORY_CARE_PROVIDER_SITE_OTHER): Payer: Medicare PPO | Admitting: Family Medicine

## 2021-05-22 ENCOUNTER — Encounter: Payer: Self-pay | Admitting: Family Medicine

## 2021-05-22 VITALS — BP 119/68 | HR 67 | Ht 74.0 in | Wt 190.0 lb

## 2021-05-22 DIAGNOSIS — N400 Enlarged prostate without lower urinary tract symptoms: Secondary | ICD-10-CM

## 2021-05-22 DIAGNOSIS — E782 Mixed hyperlipidemia: Secondary | ICD-10-CM | POA: Diagnosis not present

## 2021-05-22 DIAGNOSIS — I700 Atherosclerosis of aorta: Secondary | ICD-10-CM | POA: Diagnosis not present

## 2021-05-22 DIAGNOSIS — Z0000 Encounter for general adult medical examination without abnormal findings: Secondary | ICD-10-CM

## 2021-05-22 DIAGNOSIS — Z0001 Encounter for general adult medical examination with abnormal findings: Secondary | ICD-10-CM | POA: Diagnosis not present

## 2021-05-22 DIAGNOSIS — R918 Other nonspecific abnormal finding of lung field: Secondary | ICD-10-CM | POA: Diagnosis not present

## 2021-05-22 DIAGNOSIS — I10 Essential (primary) hypertension: Secondary | ICD-10-CM | POA: Diagnosis not present

## 2021-05-22 NOTE — Progress Notes (Signed)
? ?BP 119/68   Pulse 67   Ht $R'6\' 2"'rJ$  (1.88 m)   Wt 190 lb (86.2 kg)   SpO2 95%   BMI 24.39 kg/m?   ? ?Subjective:  ? ?Patient ID: Manuel Kidd, male    DOB: 07/13/1950, 71 y.o.   MRN: [id number] ? ?HPI: ?Manuel Kidd is a 71 y.o. male presenting on 05/22/2021 for Medical Management of Chronic Issues (CPE) ? ? ?HPI ?Physical exam ?Patient is coming in today for physical exam and recheck of chronic medical issues.  He denies any major health issues. Patient denies any chest pain, shortness of breath, headaches or vision issues, abdominal complaints, diarrhea, nausea, vomiting, or joint issues.  ? ?Hyperlipidemia ?Patient is coming in for recheck of his hyperlipidemia. The patient is currently taking fish oils. They deny any issues with myalgias or history of liver damage from it. They deny any focal numbness or weakness or chest pain.  ? ?Relevant past medical, surgical, family and social history reviewed and updated as indicated. Interim medical history since our last visit reviewed. ?Allergies and medications reviewed and updated. ? ?Review of Systems  ?Constitutional:  Negative for chills and fever.  ?HENT:  Negative for ear pain and tinnitus.   ?Eyes:  Negative for pain and discharge.  ?Respiratory:  Negative for cough, shortness of breath and wheezing.   ?Cardiovascular:  Negative for chest pain, palpitations and leg swelling.  ?Gastrointestinal:  Negative for abdominal pain, blood in stool, constipation and diarrhea.  ?Genitourinary:  Negative for dysuria and hematuria.  ?Musculoskeletal:  Negative for back pain, gait problem and myalgias.  ?Skin:  Negative for rash.  ?Neurological:  Negative for dizziness, weakness and headaches.  ?Psychiatric/Behavioral:  Negative for suicidal ideas.   ?All other systems reviewed and are negative. ? ?Per HPI unless specifically indicated above ? ? ?Allergies as of 05/22/2021   ?No Known Allergies ?  ? ?  ?Medication List  ?  ? ?  ? Accurate as of May 22, 2021 10:26 AM. If you  have any questions, ask your nurse or doctor.  ?  ?  ? ?  ? ?STOP taking these medications   ? ?amoxicillin-clavulanate 875-125 MG tablet ?Commonly known as: AUGMENTIN ?Stopped by: Worthy Rancher, MD ?  ?metroNIDAZOLE 500 MG tablet ?Commonly known as: FLAGYL ?Stopped by: Worthy Rancher, MD ?  ?Sutab [id number] MG Tabs ?Generic drug: Sodium Sulfate-Mag Sulfate-KCl ?Stopped by: Worthy Rancher, MD ?  ? ?  ? ?TAKE these medications   ? ?alfuzosin 10 MG 24 hr tablet ?Commonly known as: UROXATRAL ?Take 10 mg by mouth at bedtime. ?  ?cycloSPORINE 0.05 % ophthalmic emulsion ?Commonly known as: RESTASIS ?Place 1 drop into both eyes 2 (two) times daily. ?  ?Fish Oil 1000 MG Caps ?Take 1 capsule by mouth daily. 5 days a week ?  ?meloxicam 15 MG tablet ?Commonly known as: MOBIC ?Take 1 tablet (15 mg total) by mouth daily. ?  ?multivitamin tablet ?Take 1 tablet by mouth daily. ?  ?prednisoLONE acetate 0.12 % ophthalmic suspension ?Commonly known as: PRED MILD ?Place 1 drop into both eyes as needed. ?  ?valACYclovir 1000 MG tablet ?Commonly known as: VALTREX ?Take 1 tablet (1,000 mg total) by mouth 2 (two) times daily. ?  ?VITAMIN D (ERGOCALCIFEROL) PO ?Take 2,000 Units by mouth daily. 5 days a week ?  ? ?  ? ? ? ?Objective:  ? ?BP 119/68   Pulse 67   Ht $R'6\' 2"'IE$  (1.88 m)   Wt  190 lb (86.2 kg)   SpO2 95%   BMI 24.39 kg/m?   ?Wt Readings from Last 3 Encounters:  ?05/22/21 190 lb (86.2 kg)  ?01/18/21 193 lb (87.5 kg)  ?11/16/20 189 lb (85.7 kg)  ?  ?Physical Exam ?Vitals and nursing note reviewed.  ?Constitutional:   ?   General: He is not in acute distress. ?   Appearance: He is well-developed. He is not diaphoretic.  ?HENT:  ?   Right Ear: External ear normal.  ?   Left Ear: External ear normal.  ?   Nose: Nose normal.  ?   Mouth/Throat:  ?   Pharynx: No oropharyngeal exudate.  ?Eyes:  ?   General: No scleral icterus. ?   Conjunctiva/sclera: Conjunctivae normal.  ?Neck:  ?   Thyroid: No thyromegaly.   ?Cardiovascular:  ?   Rate and Rhythm: Normal rate and regular rhythm.  ?   Heart sounds: Normal heart sounds. No murmur heard. ?Pulmonary:  ?   Effort: Pulmonary effort is normal. No respiratory distress.  ?   Breath sounds: Normal breath sounds. No wheezing.  ?Abdominal:  ?   General: Bowel sounds are normal. There is no distension.  ?   Palpations: Abdomen is soft.  ?   Tenderness: There is no abdominal tenderness. There is no guarding or rebound.  ?Musculoskeletal:     ?   General: No swelling. Normal range of motion.  ?   Cervical back: Neck supple.  ?Lymphadenopathy:  ?   Cervical: No cervical adenopathy.  ?Skin: ?   General: Skin is warm and dry.  ?   Findings: No rash.  ?Neurological:  ?   Mental Status: He is alert and oriented to person, place, and time.  ?   Coordination: Coordination normal.  ?Psychiatric:     ?   Behavior: Behavior normal.  ? ? ?Results for orders placed or performed in visit on 05/16/21  ?Lipid panel  ?Result Value Ref Range  ? Cholesterol, Total 185 100 - 199 mg/dL  ? Triglycerides 61 0 - 149 mg/dL  ? HDL 48 >39 mg/dL  ? VLDL Cholesterol Cal 12 5 - 40 mg/dL  ? LDL Chol Calc (NIH) 125 (H) 0 - 99 mg/dL  ? Chol/HDL Ratio 3.9 0.0 - 5.0 ratio  ?CMP14+EGFR  ?Result Value Ref Range  ? Glucose 94 70 - 99 mg/dL  ? BUN 11 8 - 27 mg/dL  ? Creatinine, Ser 0.95 0.76 - 1.27 mg/dL  ? eGFR 86 >59 mL/min/1.73  ? BUN/Creatinine Ratio 12 10 - 24  ? Sodium 141 134 - 144 mmol/L  ? Potassium 4.0 3.5 - 5.2 mmol/L  ? Chloride 103 96 - 106 mmol/L  ? CO2 24 20 - 29 mmol/L  ? Calcium 9.5 8.6 - 10.2 mg/dL  ? Total Protein 6.6 6.0 - 8.5 g/dL  ? Albumin 4.4 3.8 - 4.8 g/dL  ? Globulin, Total 2.2 1.5 - 4.5 g/dL  ? Albumin/Globulin Ratio 2.0 1.2 - 2.2  ? Bilirubin Total 0.6 0.0 - 1.2 mg/dL  ? Alkaline Phosphatase 62 44 - 121 IU/L  ? AST 22 0 - 40 IU/L  ? ALT 16 0 - 44 IU/L  ?CBC with Differential/Platelet  ?Result Value Ref Range  ? WBC 4.3 3.4 - 10.8 x10E3/uL  ? RBC 5.37 4.14 - 5.80 x10E6/uL  ? Hemoglobin 15.4 13.0  - 17.7 g/dL  ? Hematocrit 45.7 37.5 - 51.0 %  ? MCV 85 79 - 97 fL  ? [place]  28.7 26.6 - 33.0 pg  ? MCHC 33.7 31.5 - 35.7 g/dL  ? RDW 12.9 11.6 - 15.4 %  ? Platelets 168 150 - 450 x10E3/uL  ? Neutrophils 67 Not Estab. %  ? Lymphs 16 Not Estab. %  ? Monocytes 7 Not Estab. %  ? Eos 8 Not Estab. %  ? Basos 2 Not Estab. %  ? Neutrophils Absolute 2.9 1.4 - 7.0 x10E3/uL  ? Lymphocytes Absolute 0.7 0.7 - 3.1 x10E3/uL  ? Monocytes Absolute 0.3 0.1 - 0.9 x10E3/uL  ? EOS (ABSOLUTE) 0.3 0.0 - 0.4 x10E3/uL  ? Basophils Absolute 0.1 0.0 - 0.2 x10E3/uL  ? Immature Granulocytes 0 Not Estab. %  ? Immature Grans (Abs) 0.0 0.0 - 0.1 x10E3/uL  ? ? ?Assessment & Plan:  ? ?Problem List Items Addressed This Visit   ? ?  ? Cardiovascular and Mediastinum  ? Hypertension  ? Aortic atherosclerosis ([place])  ?  ? Genitourinary  ? Benign prostatic hyperplasia  ? Relevant Medications  ? alfuzosin (UROXATRAL) 10 MG 24 hr tablet  ?  ? Other  ? Hyperlipidemia  ? Abnormal CT scan of lung  ? ?Other Visit Diagnoses   ? ? Physical exam    -  Primary  ? ?  ?Patient is seeing urology for his bladder and prostate issues.  They started him on the medicine and he feels like it is helping some.  He has a procedure coming up with them for that. ? ?He does have a history of the abnormal CT for lung and we discussed that they said no further follow-up was needed but discussed that he was nervous about it still and would like follow-up.  We will readdress that in the fall. ?Cholesterol slightly elevated and we discussed risks and treatment options.  He would like to hold off on treatment for now and continue to try diet. ? ?The 10-year ASCVD risk score (Arnett DK, et al., 2019) is: 15.8% ?  Values used to calculate the score: ?    Age: 67 years ?    Sex: Male ?    Is Non-Hispanic African American: No ?    Diabetic: No ?    Tobacco smoker: No ?    Systolic Blood Pressure: 223 mmHg ?    Is BP treated: No ?    HDL Cholesterol: 48 mg/dL ?    Total Cholesterol: 185  mg/dL ? ?Follow up plan: ?Return in about 6 months (around 11/22/2021), or if symptoms worsen or fail to improve, for Cholesterol recheck. ? ?Counseling provided for all of the vaccine components ?No orders of the defined types we

## 2021-06-03 DIAGNOSIS — R399 Unspecified symptoms and signs involving the genitourinary system: Secondary | ICD-10-CM | POA: Diagnosis not present

## 2021-06-03 DIAGNOSIS — N401 Enlarged prostate with lower urinary tract symptoms: Secondary | ICD-10-CM | POA: Diagnosis not present

## 2021-06-03 DIAGNOSIS — R972 Elevated prostate specific antigen [PSA]: Secondary | ICD-10-CM | POA: Diagnosis not present

## 2021-06-24 DIAGNOSIS — R972 Elevated prostate specific antigen [PSA]: Secondary | ICD-10-CM | POA: Diagnosis not present

## 2021-07-07 ENCOUNTER — Encounter: Payer: Self-pay | Admitting: Orthopaedic Surgery

## 2021-07-08 ENCOUNTER — Other Ambulatory Visit: Payer: Self-pay

## 2021-07-08 DIAGNOSIS — G8929 Other chronic pain: Secondary | ICD-10-CM

## 2021-07-10 DIAGNOSIS — H524 Presbyopia: Secondary | ICD-10-CM | POA: Diagnosis not present

## 2021-07-10 DIAGNOSIS — H04123 Dry eye syndrome of bilateral lacrimal glands: Secondary | ICD-10-CM | POA: Diagnosis not present

## 2021-07-10 DIAGNOSIS — H02889 Meibomian gland dysfunction of unspecified eye, unspecified eyelid: Secondary | ICD-10-CM | POA: Diagnosis not present

## 2021-07-10 DIAGNOSIS — H5203 Hypermetropia, bilateral: Secondary | ICD-10-CM | POA: Diagnosis not present

## 2021-07-19 ENCOUNTER — Telehealth: Payer: Self-pay

## 2021-07-19 NOTE — Telephone Encounter (Signed)
Recall colon at [facility]

## 2021-07-19 NOTE — Telephone Encounter (Signed)
-----   Message from Chrystie Nose, RN sent at 03/28/2021  1:49 PM EDT ----- Regarding: Colon at [facility] Pt needs screening colon at [facility] in August

## 2021-07-30 NOTE — Telephone Encounter (Signed)
Offered pt appt 9/11 for colon with Dr. Terri Piedra but pt states that date will not work. Would like to try for October. Reminder in epic for contact pt when Oct schedule comes out.

## 2021-08-12 ENCOUNTER — Encounter: Payer: Self-pay | Admitting: Physical Medicine and Rehabilitation

## 2021-08-12 ENCOUNTER — Ambulatory Visit: Payer: Self-pay

## 2021-08-12 ENCOUNTER — Ambulatory Visit: Payer: Medicare PPO | Admitting: Physical Medicine and Rehabilitation

## 2021-08-12 DIAGNOSIS — G8929 Other chronic pain: Secondary | ICD-10-CM | POA: Diagnosis not present

## 2021-08-12 DIAGNOSIS — M25511 Pain in right shoulder: Secondary | ICD-10-CM | POA: Diagnosis not present

## 2021-08-12 MED ORDER — TRIAMCINOLONE ACETONIDE 40 MG/ML IJ SUSP
40.0000 mg | INTRAMUSCULAR | Status: AC | PRN
  Administered 2021-08-12: 40 mg via INTRA_ARTICULAR

## 2021-08-12 MED ORDER — BUPIVACAINE HCL 0.25 % IJ SOLN
5.0000 mL | INTRAMUSCULAR | Status: AC | PRN
  Administered 2021-08-12: 5 mL via INTRA_ARTICULAR

## 2021-08-12 NOTE — Progress Notes (Signed)
   Manuel Kidd - 71 y.o. male MRN [id number]  Date of birth: 07-20-1950  Office Visit Note: Visit Date: 08/12/2021 PCP: Dettinger, Elige Radon, MD Referred by: Dettinger, Elige Radon, MD  Subjective: Chief Complaint  Patient presents with   Right Shoulder - Pain   HPI:  Manuel Kidd is a 71 y.o. male who comes in today at the request of Dr. Doneen Poisson for planned Right anesthetic glenohumeral arthrogram with fluoroscopic guidance.  The patient has failed conservative care including home exercise, medications, time and activity modification.  This injection will be diagnostic and hopefully therapeutic.  Please see requesting physician notes for further details and justification.   ROS Otherwise per HPI.  Assessment & Plan: Visit Diagnoses:    ICD-10-CM   1. Chronic right shoulder pain  M25.511 Large Joint Inj: R glenohumeral   G89.29 XR C-ARM NO REPORT      Plan: No additional findings.   Meds & Orders: No orders of the defined types were placed in this encounter.   Orders Placed This Encounter  Procedures   Large Joint Inj: R glenohumeral   XR C-ARM NO REPORT    Follow-up: Return for visit to requesting provider as needed.   Procedures: Large Joint Inj: R glenohumeral on 08/12/2021 9:33 AM Indications: pain and diagnostic evaluation Details: 22 G 3.5 in needle, fluoroscopy-guided anteromedial approach  Arthrogram: No  Medications: 40 mg triamcinolone acetonide 40 MG/ML; 5 mL bupivacaine 0.25 % Outcome: tolerated well, no immediate complications  There was excellent flow of contrast producing a partial arthrogram of the glenohumeral joint. The patient did have relief of symptoms during the anesthetic phase of the injection. Procedure, treatment alternatives, risks and benefits explained, specific risks discussed. Consent was given by the patient. Immediately prior to procedure a time out was called to verify the correct patient, procedure, equipment, support staff and  site/side marked as required. Patient was prepped and draped in the usual sterile fashion.          Clinical History: No specialty comments available.     Objective:  VS:  HT:    WT:   BMI:     BP:   HR: bpm  TEMP: ( )  RESP:  Physical Exam   Imaging: XR C-ARM NO REPORT  Result Date: 08/12/2021 Please see Notes tab for imaging impression.

## 2021-08-12 NOTE — Progress Notes (Signed)
Pt state right shoulder pain. Pt state when reaching behind his back and lifting his arm makes the pain worse. Pt state he doesn't take anything for the pain.  Numeric Pain Rating Scale and Functional Assessment Average Pain 4   In the last MONTH (on 0-10 scale) has pain interfered with the following?  1. General activity like being  able to carry out your everyday physical activities such as walking, climbing stairs, carrying groceries, or moving a chair?  Rating(10)    -BT, -Dye Allergies.

## 2021-08-29 DIAGNOSIS — R1013 Epigastric pain: Secondary | ICD-10-CM | POA: Diagnosis not present

## 2021-08-29 DIAGNOSIS — R140 Abdominal distension (gaseous): Secondary | ICD-10-CM | POA: Diagnosis not present

## 2021-09-12 ENCOUNTER — Telehealth: Payer: Self-pay

## 2021-09-12 NOTE — Telephone Encounter (Signed)
Had reminder to schedule pt an appt for hosp colon in October. Upon reviewing chart found that pt was seen by [facility] 08/29/21 for second opinion of abd pain. The OV note states they are planning for EGD and Colon. Will remove him from wait list as it appears he has transferred care. Dr. Terri Piedra aware.

## 2021-09-12 NOTE — Telephone Encounter (Signed)
-----   Message from Chrystie Nose, RN sent at 07/30/2021  3:16 PM EDT ----- Regarding: Hosp colon Pt needs colon at [facility] would like October cannot do 10/19 or 10/25

## 2021-09-17 DIAGNOSIS — R1013 Epigastric pain: Secondary | ICD-10-CM | POA: Diagnosis not present

## 2021-09-17 DIAGNOSIS — K317 Polyp of stomach and duodenum: Secondary | ICD-10-CM | POA: Diagnosis not present

## 2021-09-17 DIAGNOSIS — K2970 Gastritis, unspecified, without bleeding: Secondary | ICD-10-CM | POA: Diagnosis not present

## 2021-09-17 DIAGNOSIS — Z1211 Encounter for screening for malignant neoplasm of colon: Secondary | ICD-10-CM | POA: Diagnosis not present

## 2021-09-17 DIAGNOSIS — Z800 Family history of malignant neoplasm of digestive organs: Secondary | ICD-10-CM | POA: Diagnosis not present

## 2021-10-08 DIAGNOSIS — N401 Enlarged prostate with lower urinary tract symptoms: Secondary | ICD-10-CM | POA: Diagnosis not present

## 2021-10-08 DIAGNOSIS — R972 Elevated prostate specific antigen [PSA]: Secondary | ICD-10-CM | POA: Diagnosis not present

## 2021-10-31 DIAGNOSIS — L578 Other skin changes due to chronic exposure to nonionizing radiation: Secondary | ICD-10-CM | POA: Diagnosis not present

## 2021-10-31 DIAGNOSIS — L821 Other seborrheic keratosis: Secondary | ICD-10-CM | POA: Diagnosis not present

## 2021-10-31 DIAGNOSIS — L309 Dermatitis, unspecified: Secondary | ICD-10-CM | POA: Diagnosis not present

## 2021-10-31 DIAGNOSIS — L570 Actinic keratosis: Secondary | ICD-10-CM | POA: Diagnosis not present

## 2021-10-31 DIAGNOSIS — L814 Other melanin hyperpigmentation: Secondary | ICD-10-CM | POA: Diagnosis not present

## 2021-11-14 DIAGNOSIS — R140 Abdominal distension (gaseous): Secondary | ICD-10-CM | POA: Diagnosis not present

## 2021-11-14 DIAGNOSIS — R109 Unspecified abdominal pain: Secondary | ICD-10-CM | POA: Diagnosis not present

## 2021-11-14 DIAGNOSIS — K63829 Intestinal methanogen overgrowth, unspecified: Secondary | ICD-10-CM | POA: Diagnosis not present

## 2021-11-16 ENCOUNTER — Encounter: Payer: Self-pay | Admitting: Family Medicine

## 2021-11-19 ENCOUNTER — Other Ambulatory Visit: Payer: Medicare PPO

## 2021-11-19 DIAGNOSIS — Z0000 Encounter for general adult medical examination without abnormal findings: Secondary | ICD-10-CM

## 2021-11-19 DIAGNOSIS — Z136 Encounter for screening for cardiovascular disorders: Secondary | ICD-10-CM | POA: Diagnosis not present

## 2021-11-20 LAB — CBC WITH DIFFERENTIAL/PLATELET
Basophils Absolute: 0.1 10*3/uL (ref 0.0–0.2)
Basos: 1 %
EOS (ABSOLUTE): 0.3 10*3/uL (ref 0.0–0.4)
Eos: 7 %
Hematocrit: 44.5 % (ref 37.5–51.0)
Hemoglobin: 14.9 g/dL (ref 13.0–17.7)
Immature Grans (Abs): 0 10*3/uL (ref 0.0–0.1)
Immature Granulocytes: 0 %
Lymphocytes Absolute: 0.9 10*3/uL (ref 0.7–3.1)
Lymphs: 21 %
MCH: 28.8 pg (ref 26.6–33.0)
MCHC: 33.5 g/dL (ref 31.5–35.7)
MCV: 86 fL (ref 79–97)
Monocytes Absolute: 0.4 10*3/uL (ref 0.1–0.9)
Monocytes: 8 %
Neutrophils Absolute: 2.8 10*3/uL (ref 1.4–7.0)
Neutrophils: 63 %
Platelets: 157 10*3/uL (ref 150–450)
RBC: 5.18 x10E6/uL (ref 4.14–5.80)
RDW: 12.9 % (ref 11.6–15.4)
WBC: 4.4 10*3/uL (ref 3.4–10.8)

## 2021-11-20 LAB — CMP14+EGFR
ALT: 14 IU/L (ref 0–44)
AST: 22 IU/L (ref 0–40)
Albumin/Globulin Ratio: 2 (ref 1.2–2.2)
Albumin: 4.5 g/dL (ref 3.8–4.8)
Alkaline Phosphatase: 62 IU/L (ref 44–121)
BUN/Creatinine Ratio: 14 (ref 10–24)
BUN: 12 mg/dL (ref 8–27)
Bilirubin Total: 0.8 mg/dL (ref 0.0–1.2)
CO2: 23 mmol/L (ref 20–29)
Calcium: 9.5 mg/dL (ref 8.6–10.2)
Chloride: 103 mmol/L (ref 96–106)
Creatinine, Ser: 0.85 mg/dL (ref 0.76–1.27)
Globulin, Total: 2.2 g/dL (ref 1.5–4.5)
Glucose: 93 mg/dL (ref 70–99)
Potassium: 3.9 mmol/L (ref 3.5–5.2)
Sodium: 142 mmol/L (ref 134–144)
Total Protein: 6.7 g/dL (ref 6.0–8.5)
eGFR: 93 mL/min/{1.73_m2} (ref >59)

## 2021-11-20 LAB — LIPID PANEL
Chol/HDL Ratio: 3.5 ratio (ref 0.0–5.0)
Cholesterol, Total: 179 mg/dL (ref 100–199)
HDL: 51 mg/dL (ref >39)
LDL Chol Calc (NIH): 118 mg/dL — ABNORMAL HIGH (ref 0–99)
Triglycerides: 53 mg/dL (ref 0–149)
VLDL Cholesterol Cal: 10 mg/dL (ref 5–40)

## 2021-11-25 ENCOUNTER — Encounter: Payer: Self-pay | Admitting: Family Medicine

## 2021-11-25 ENCOUNTER — Ambulatory Visit: Payer: Medicare PPO | Admitting: Family Medicine

## 2021-11-25 VITALS — BP 128/75 | HR 56 | Temp 98.0°F | Ht 74.0 in | Wt 193.0 lb

## 2021-11-25 DIAGNOSIS — E782 Mixed hyperlipidemia: Secondary | ICD-10-CM

## 2021-11-25 DIAGNOSIS — I10 Essential (primary) hypertension: Secondary | ICD-10-CM | POA: Diagnosis not present

## 2021-11-25 DIAGNOSIS — I700 Atherosclerosis of aorta: Secondary | ICD-10-CM

## 2021-11-25 MED ORDER — EZETIMIBE 10 MG PO TABS: 10.0000 mg | ORAL_TABLET | Freq: Every day | ORAL | 3 refills | Status: DC

## 2021-11-25 NOTE — Progress Notes (Signed)
BP 128/75   Pulse (!) 56   Temp 98 F (36.7 C)   Ht _0  (1.88 m)   Wt 193 lb (87.5 kg)   SpO2 98%   BMI 24.78 kg/m    Subjective:   Patient ID: Manuel Kidd, male    DOB: 11-20-1950, 71 y.o.   MRN: [id number]  HPI: Manuel Kidd is a 71 y.o. male presenting on 11/25/2021 for Medical Management of Chronic Issues and Hyperlipidemia   HPI Hyperlipidemia Patient is coming in for recheck of his hyperlipidemia. The patient is currently taking fish oils. They deny any issues with myalgias or history of liver damage from it. They deny any focal numbness or weakness or chest pain.   Hypertension Patient is currently on no medicine currently, diet control, and their blood pressure today is 128/75. Patient denies any lightheadedness or dizziness. Patient denies headaches, blurred vision, chest pains, shortness of breath, or weakness. Denies any side effects from medication and is content with current medication.   Relevant past medical, surgical, family and social history reviewed and updated as indicated. Interim medical history since our last visit reviewed. Allergies and medications reviewed and updated.  Review of Systems  Constitutional:  Negative for chills and fever.  Eyes:  Negative for visual disturbance.  Respiratory:  Negative for shortness of breath and wheezing.   Cardiovascular:  Negative for chest pain and leg swelling.  Musculoskeletal:  Negative for back pain and gait problem.  Skin:  Negative for rash.  Neurological:  Negative for dizziness, weakness and light-headedness.  All other systems reviewed and are negative.   Per HPI unless specifically indicated above   Allergies as of 11/25/2021   No Known Allergies      Medication List        Accurate as of November 25, 2021 10:25 AM. If you have any questions, ask your nurse or doctor.          alfuzosin 10 MG 24 hr tablet Commonly known as: UROXATRAL Take 10 mg by mouth at bedtime.   cycloSPORINE  0.05 % ophthalmic emulsion Commonly known as: RESTASIS Place 1 drop into both eyes 2 (two) times daily.   ezetimibe 10 MG tablet Commonly known as: Zetia Take 1 tablet (10 mg total) by mouth daily. Started by: Manuel Kaufmann Manuel Maffei, MD   Fish Oil 1000 MG Caps Take 1 capsule by mouth daily. 5 days a week   meloxicam 15 MG tablet Commonly known as: MOBIC Take 1 tablet (15 mg total) by mouth daily.   multivitamin tablet Take 1 tablet by mouth daily.   neomycin 500 MG tablet Commonly known as: MYCIFRADIN Take 500 mg by mouth once.   prednisoLONE acetate 0.12 % ophthalmic suspension Commonly known as: PRED MILD Place 1 drop into both eyes as needed.   valACYclovir 1000 MG tablet Commonly known as: VALTREX Take 1 tablet (1,000 mg total) by mouth 2 (two) times daily.   VITAMIN D (ERGOCALCIFEROL) PO Take 2,000 Units by mouth daily. 5 days a week   Xifaxan 550 MG Tabs tablet Generic drug: rifaximin Take 550 mg by mouth 3 (three) times daily.         Objective:   BP 128/75   Pulse (!) 56   Temp 98 F (36.7 C)   Ht _1  (1.88 m)   Wt 193 lb (87.5 kg)   SpO2 98%   BMI 24.78 kg/m   Wt Readings from Last 3 Encounters:  11/25/21 193 lb (87.5 kg)  05/22/21 190 lb (86.2 kg)  01/18/21 193 lb (87.5 kg)    Physical Exam Vitals and nursing note reviewed.  Constitutional:      General: He is not in acute distress.    Appearance: He is well-developed. He is not diaphoretic.  Eyes:     General: No scleral icterus.    Conjunctiva/sclera: Conjunctivae normal.  Neck:     Thyroid: No thyromegaly.  Cardiovascular:     Rate and Rhythm: Normal rate and regular rhythm.     Heart sounds: Normal heart sounds. No murmur heard. Pulmonary:     Effort: Pulmonary effort is normal. No respiratory distress.     Breath sounds: Normal breath sounds. No wheezing.  Musculoskeletal:        General: No swelling. Normal range of motion.     Cervical back: Neck supple.  Lymphadenopathy:      Cervical: No cervical adenopathy.  Skin:    General: Skin is warm and dry.     Findings: No rash.  Neurological:     Mental Status: He is alert and oriented to person, place, and time.     Coordination: Coordination normal.  Psychiatric:        Behavior: Behavior normal.     Results for orders placed or performed in visit on 11/19/21  CMP14+EGFR  Result Value Ref Range   Glucose 93 70 - 99 mg/dL   BUN 12 8 - 27 mg/dL   Creatinine, Ser 0.85 0.76 - 1.27 mg/dL   eGFR 93 >59 mL/min/1.73   BUN/Creatinine Ratio 14 10 - 24   Sodium 142 134 - 144 mmol/L   Potassium 3.9 3.5 - 5.2 mmol/L   Chloride 103 96 - 106 mmol/L   CO2 23 20 - 29 mmol/L   Calcium 9.5 8.6 - 10.2 mg/dL   Total Protein 6.7 6.0 - 8.5 g/dL   Albumin 4.5 3.8 - 4.8 g/dL   Globulin, Total 2.2 1.5 - 4.5 g/dL   Albumin/Globulin Ratio 2.0 1.2 - 2.2   Bilirubin Total 0.8 0.0 - 1.2 mg/dL   Alkaline Phosphatase 62 44 - 121 IU/L   AST 22 0 - 40 IU/L   ALT 14 0 - 44 IU/L  CBC with Differential/Platelet  Result Value Ref Range   WBC 4.4 3.4 - 10.8 x10E3/uL   RBC 5.18 4.14 - 5.80 x10E6/uL   Hemoglobin 14.9 13.0 - 17.7 g/dL   Hematocrit 44.5 37.5 - 51.0 %   MCV 86 79 - 97 fL   MCH 28.8 26.6 - 33.0 pg   MCHC 33.5 31.5 - 35.7 g/dL   RDW 12.9 11.6 - 15.4 %   Platelets 157 150 - 450 x10E3/uL   Neutrophils 63 Not Estab. %   Lymphs 21 Not Estab. %   Monocytes 8 Not Estab. %   Eos 7 Not Estab. %   Basos 1 Not Estab. %   Neutrophils Absolute 2.8 1.4 - 7.0 x10E3/uL   Lymphocytes Absolute 0.9 0.7 - 3.1 x10E3/uL   Monocytes Absolute 0.4 0.1 - 0.9 x10E3/uL   EOS (ABSOLUTE) 0.3 0.0 - 0.4 x10E3/uL   Basophils Absolute 0.1 0.0 - 0.2 x10E3/uL   Immature Granulocytes 0 Not Estab. %   Immature Grans (Abs) 0.0 0.0 - 0.1 x10E3/uL  Lipid panel  Result Value Ref Range   Cholesterol, Total 179 100 - 199 mg/dL   Triglycerides 53 0 - 149 mg/dL   HDL 51 >39 mg/dL   VLDL Cholesterol Cal 10 5 - 40 mg/dL  LDL Chol Calc (NIH) 118 (H) 0 -  99 mg/dL   Chol/HDL Ratio 3.5 0.0 - 5.0 ratio    Assessment & Plan:   Problem List Items Addressed This Visit       Cardiovascular and Mediastinum   Hypertension   Relevant Medications   ezetimibe (ZETIA) 10 MG tablet   Aortic atherosclerosis ([facility])   Relevant Medications   ezetimibe (ZETIA) 10 MG tablet     Other   Hyperlipidemia - Primary   Relevant Medications   ezetimibe (ZETIA) 10 MG tablet    LDL still slightly high, is the main issue.  He would like to try medicine for it but not a statin.  We will try Zetia Follow up plan: Return in about 6 months (around 05/26/2022), or if symptoms worsen or fail to improve, for Cholesterol recheck.  Counseling provided for all of the vaccine components No orders of the defined types were placed in this encounter.   Caryl Pina, MD [facility] 11/25/2021, 10:25 AM

## 2021-11-29 DIAGNOSIS — K638219 Small intestinal bacterial overgrowth, unspecified: Secondary | ICD-10-CM | POA: Diagnosis not present

## 2022-01-28 DIAGNOSIS — H524 Presbyopia: Secondary | ICD-10-CM | POA: Diagnosis not present

## 2022-01-28 DIAGNOSIS — H5203 Hypermetropia, bilateral: Secondary | ICD-10-CM | POA: Diagnosis not present

## 2022-01-28 DIAGNOSIS — H02889 Meibomian gland dysfunction of unspecified eye, unspecified eyelid: Secondary | ICD-10-CM | POA: Diagnosis not present

## 2022-01-28 DIAGNOSIS — H04123 Dry eye syndrome of bilateral lacrimal glands: Secondary | ICD-10-CM | POA: Diagnosis not present

## 2022-03-19 ENCOUNTER — Encounter: Payer: Self-pay | Admitting: *Deleted

## 2022-04-22 DIAGNOSIS — Z1330 Encounter for screening examination for mental health and behavioral disorders, unspecified: Secondary | ICD-10-CM | POA: Diagnosis not present

## 2022-04-22 DIAGNOSIS — R972 Elevated prostate specific antigen [PSA]: Secondary | ICD-10-CM | POA: Diagnosis not present

## 2022-04-22 DIAGNOSIS — N401 Enlarged prostate with lower urinary tract symptoms: Secondary | ICD-10-CM | POA: Diagnosis not present

## 2022-05-26 DIAGNOSIS — R972 Elevated prostate specific antigen [PSA]: Secondary | ICD-10-CM | POA: Diagnosis not present

## 2022-05-28 ENCOUNTER — Encounter: Payer: Medicare PPO | Admitting: Family Medicine

## 2022-06-08 DIAGNOSIS — S61219A Laceration without foreign body of unspecified finger without damage to nail, initial encounter: Secondary | ICD-10-CM | POA: Diagnosis not present

## 2022-06-26 ENCOUNTER — Encounter: Payer: Self-pay | Admitting: Family Medicine

## 2022-06-26 ENCOUNTER — Ambulatory Visit (INDEPENDENT_AMBULATORY_CARE_PROVIDER_SITE_OTHER): Payer: Medicare PPO | Admitting: Family Medicine

## 2022-06-26 VITALS — BP 119/70 | HR 59 | Ht 74.0 in | Wt 191.0 lb

## 2022-06-26 DIAGNOSIS — Z0001 Encounter for general adult medical examination with abnormal findings: Secondary | ICD-10-CM | POA: Diagnosis not present

## 2022-06-26 DIAGNOSIS — I10 Essential (primary) hypertension: Secondary | ICD-10-CM | POA: Diagnosis not present

## 2022-06-26 DIAGNOSIS — N400 Enlarged prostate without lower urinary tract symptoms: Secondary | ICD-10-CM | POA: Diagnosis not present

## 2022-06-26 DIAGNOSIS — E782 Mixed hyperlipidemia: Secondary | ICD-10-CM

## 2022-06-26 DIAGNOSIS — R911 Solitary pulmonary nodule: Secondary | ICD-10-CM

## 2022-06-26 DIAGNOSIS — Z0000 Encounter for general adult medical examination without abnormal findings: Secondary | ICD-10-CM

## 2022-06-26 NOTE — Progress Notes (Signed)
BP 119/70   Pulse (!) 59   Ht 6\' 2"  (1.88 m)   Wt 191 lb (86.6 kg)   SpO2 98%   BMI 24.52 kg/m    Subjective:   Patient ID: Manuel Kidd, male    DOB: 1950/04/06, 72 y.o.   MRN: [id number]  HPI: Manuel Kidd is a 72 y.o. male presenting on 06/26/2022 for Medical Management of Chronic Issues (CPE) and Hyperlipidemia   HPI Physical exam Patient denies any chest pain, shortness of breath, headaches or vision issues, abdominal complaints, diarrhea, nausea, vomiting, or joint issues.  Patient still sees urology and feels like things are going well there.  Hypertension Patient is currently on no medicine, diet control, and their blood pressure today is 119/70. Patient denies any lightheadedness or dizziness. Patient denies headaches, blurred vision, chest pains, shortness of breath, or weakness. Denies any side effects from medication and is content with current medication.   Hyperlipidemia Patient is coming in for recheck of his hyperlipidemia. The patient is currently taking fish oils, since he could not tolerate the Zetia because of muscle aches. They deny any issues with myalgias or history of liver damage from it. They deny any focal numbness or weakness or chest pain.   BPH Patient is coming in for recheck on BPH Symptoms: None currently Medication: Alfuzosin Last PSA: Sees urology and gets it checked there.  Relevant past medical, surgical, family and social history reviewed and updated as indicated. Interim medical history since our last visit reviewed. Allergies and medications reviewed and updated.  Review of Systems  Constitutional:  Negative for chills and fever.  HENT:  Negative for ear pain and tinnitus.   Eyes:  Negative for pain.  Respiratory:  Negative for cough, shortness of breath and wheezing.   Cardiovascular:  Negative for chest pain, palpitations and leg swelling.  Gastrointestinal:  Negative for abdominal pain, blood in stool, constipation and diarrhea.   Genitourinary:  Negative for dysuria and hematuria.  Musculoskeletal:  Negative for back pain and myalgias.  Skin:  Negative for rash.  Neurological:  Negative for dizziness, weakness and headaches.  Psychiatric/Behavioral:  Negative for suicidal ideas.     Per HPI unless specifically indicated above   Allergies as of 06/26/2022   No Known Allergies      Medication List        Accurate as of June 26, 2022  9:59 AM. If you have any questions, ask your nurse or doctor.          STOP taking these medications    ezetimibe 10 MG tablet Commonly known as: Zetia Stopped by: Elige Radon Treshon Stannard, MD       TAKE these medications    alfuzosin 10 MG 24 hr tablet Commonly known as: UROXATRAL Take 10 mg by mouth at bedtime.   cycloSPORINE 0.05 % ophthalmic emulsion Commonly known as: RESTASIS Place 1 drop into both eyes 2 (two) times daily.   Fish Oil 1000 MG Caps Take 1 capsule by mouth daily. 5 days a week   meloxicam 15 MG tablet Commonly known as: MOBIC Take 1 tablet (15 mg total) by mouth daily.   multivitamin tablet Take 1 tablet by mouth daily.   neomycin 500 MG tablet Commonly known as: MYCIFRADIN Take 500 mg by mouth once.   prednisoLONE acetate 0.12 % ophthalmic suspension Commonly known as: PRED MILD Place 1 drop into both eyes as needed.   valACYclovir 1000 MG tablet Commonly known as: VALTREX Take 1 tablet (1,000  mg total) by mouth 2 (two) times daily.   VITAMIN D (ERGOCALCIFEROL) PO Take 2,000 Units by mouth daily. 5 days a week         Objective:   BP 119/70   Pulse (!) 59   Ht 6\' 2"  (1.88 m)   Wt 191 lb (86.6 kg)   SpO2 98%   BMI 24.52 kg/m   Wt Readings from Last 3 Encounters:  06/26/22 191 lb (86.6 kg)  11/25/21 193 lb (87.5 kg)  05/22/21 190 lb (86.2 kg)    Physical Exam Vitals reviewed.  Constitutional:      General: He is not in acute distress.    Appearance: He is well-developed. He is not diaphoretic.  HENT:      Right Ear: External ear normal.     Left Ear: External ear normal.     Nose: Nose normal.     Mouth/Throat:     Pharynx: No oropharyngeal exudate.  Eyes:     General: No scleral icterus.    Conjunctiva/sclera: Conjunctivae normal.  Neck:     Thyroid: No thyromegaly.  Cardiovascular:     Rate and Rhythm: Normal rate and regular rhythm.     Heart sounds: Normal heart sounds. No murmur heard. Pulmonary:     Effort: Pulmonary effort is normal. No respiratory distress.     Breath sounds: Normal breath sounds. No wheezing.  Abdominal:     General: Bowel sounds are normal. There is no distension.     Palpations: Abdomen is soft.     Tenderness: There is no abdominal tenderness. There is no guarding or rebound.  Musculoskeletal:        General: No swelling. Normal range of motion.     Cervical back: Neck supple.  Lymphadenopathy:     Cervical: No cervical adenopathy.  Skin:    General: Skin is warm and dry.     Findings: No rash.  Neurological:     Mental Status: He is alert and oriented to person, place, and time.     Coordination: Coordination normal.  Psychiatric:        Behavior: Behavior normal.       Assessment & Plan:   Problem List Items Addressed This Visit       Cardiovascular and Mediastinum   Hypertension     Genitourinary   Benign prostatic hyperplasia     Other   Hyperlipidemia   Solitary pulmonary nodule   Relevant Orders   CT CHEST LCS NODULE F/U LOW DOSE WO CONTRAST   Other Visit Diagnoses     Physical exam    -  Primary       Will order repeat CT for his lungs because it has been a while since we checked up on the nodules that he had before.  He denies any respiratory issues. Blood pressure everything looks good. Patient says that he could not tolerate the Zetia because it caused muscle aches.  We will check his blood work today and see what we can discuss about cholesterol Follow up plan: Return in about 6 months (around 12/26/2022), or if  symptoms worsen or fail to improve, for Hyperlipidemia and hypertension.  Counseling provided for all of the vaccine components Orders Placed This Encounter  Procedures   CT CHEST LCS NODULE F/U LOW DOSE WO CONTRAST    Arville Care, MD [facility] 06/26/2022, 9:59 AM

## 2022-06-26 NOTE — Addendum Note (Signed)
Addended by: Arville Care on: 06/26/2022 10:04 AM   Modules accepted: Orders

## 2022-06-27 ENCOUNTER — Telehealth: Payer: Self-pay | Admitting: Family Medicine

## 2022-06-27 LAB — LIPID PANEL
Chol/HDL Ratio: 3.4 ratio (ref 0.0–5.0)
Cholesterol, Total: 158 mg/dL (ref 100–199)
HDL: 46 mg/dL (ref >39)
LDL Chol Calc (NIH): 103 mg/dL — ABNORMAL HIGH (ref 0–99)
Triglycerides: 40 mg/dL (ref 0–149)
VLDL Cholesterol Cal: 9 mg/dL (ref 5–40)

## 2022-06-27 LAB — CBC WITH DIFFERENTIAL/PLATELET
Basophils Absolute: 0.1 10*3/uL (ref 0.0–0.2)
Basos: 1 %
EOS (ABSOLUTE): 0.4 10*3/uL (ref 0.0–0.4)
Eos: 10 %
Hematocrit: 42.9 % (ref 37.5–51.0)
Hemoglobin: 14.5 g/dL (ref 13.0–17.7)
Immature Grans (Abs): 0 10*3/uL (ref 0.0–0.1)
Immature Granulocytes: 0 %
Lymphocytes Absolute: 0.9 10*3/uL (ref 0.7–3.1)
Lymphs: 21 %
MCH: 28.5 pg (ref 26.6–33.0)
MCHC: 33.8 g/dL (ref 31.5–35.7)
MCV: 84 fL (ref 79–97)
Monocytes Absolute: 0.3 10*3/uL (ref 0.1–0.9)
Monocytes: 7 %
Neutrophils Absolute: 2.6 10*3/uL (ref 1.4–7.0)
Neutrophils: 61 %
Platelets: 177 10*3/uL (ref 150–450)
RBC: 5.08 x10E6/uL (ref 4.14–5.80)
RDW: 12.8 % (ref 11.6–15.4)
WBC: 4.2 10*3/uL (ref 3.4–10.8)

## 2022-06-27 LAB — CMP14+EGFR
ALT: 14 IU/L (ref 0–44)
AST: 22 IU/L (ref 0–40)
Albumin/Globulin Ratio: 2.3
Albumin: 4.4 g/dL (ref 3.8–4.8)
Alkaline Phosphatase: 63 IU/L (ref 44–121)
BUN/Creatinine Ratio: 21 (ref 10–24)
BUN: 20 mg/dL (ref 8–27)
Bilirubin Total: 0.7 mg/dL (ref 0.0–1.2)
CO2: 23 mmol/L (ref 20–29)
Calcium: 9.3 mg/dL (ref 8.6–10.2)
Chloride: 107 mmol/L — ABNORMAL HIGH (ref 96–106)
Creatinine, Ser: 0.94 mg/dL (ref 0.76–1.27)
Globulin, Total: 1.9 g/dL (ref 1.5–4.5)
Glucose: 93 mg/dL (ref 70–99)
Potassium: 4.2 mmol/L (ref 3.5–5.2)
Sodium: 141 mmol/L (ref 134–144)
Total Protein: 6.3 g/dL (ref 6.0–8.5)
eGFR: 87 mL/min/{1.73_m2} (ref >59)

## 2022-06-27 NOTE — Telephone Encounter (Signed)
Pts wife called about CT appt and says she wants it cancelled at [facility] because pt has his testing done at [facility]. Want referral sent to [facility] and she will call to make pt an appt.

## 2022-07-30 ENCOUNTER — Ambulatory Visit
Admission: RE | Admit: 2022-07-30 | Discharge: 2022-07-30 | Disposition: A | Discharge status: Home / Self Care | Payer: Medicare PPO | Source: Ambulatory Visit | Attending: Family Medicine | Admitting: Family Medicine

## 2022-07-30 DIAGNOSIS — R918 Other nonspecific abnormal finding of lung field: Secondary | ICD-10-CM | POA: Diagnosis not present

## 2022-07-30 DIAGNOSIS — R911 Solitary pulmonary nodule: Secondary | ICD-10-CM

## 2022-07-30 DIAGNOSIS — I700 Atherosclerosis of aorta: Secondary | ICD-10-CM | POA: Diagnosis not present

## 2022-08-01 ENCOUNTER — Encounter: Payer: Self-pay | Admitting: Family Medicine

## 2022-08-01 ENCOUNTER — Encounter (HOSPITAL_COMMUNITY): Payer: Medicare PPO

## 2022-08-07 ENCOUNTER — Other Ambulatory Visit: Payer: Self-pay

## 2022-08-07 DIAGNOSIS — I779 Disorder of arteries and arterioles, unspecified: Secondary | ICD-10-CM

## 2022-10-03 ENCOUNTER — Encounter: Payer: Self-pay | Admitting: Family Medicine

## 2022-10-03 ENCOUNTER — Ambulatory Visit: Payer: Medicare PPO | Admitting: Family Medicine

## 2022-10-03 VITALS — BP 115/65 | HR 60 | Temp 98.7°F | Ht 74.0 in | Wt 194.0 lb

## 2022-10-03 DIAGNOSIS — N400 Enlarged prostate without lower urinary tract symptoms: Secondary | ICD-10-CM | POA: Diagnosis not present

## 2022-10-03 DIAGNOSIS — I10 Essential (primary) hypertension: Secondary | ICD-10-CM

## 2022-10-03 DIAGNOSIS — I700 Atherosclerosis of aorta: Secondary | ICD-10-CM | POA: Diagnosis not present

## 2022-10-03 DIAGNOSIS — E782 Mixed hyperlipidemia: Secondary | ICD-10-CM | POA: Diagnosis not present

## 2022-10-03 MED ORDER — VALACYCLOVIR HCL 1 G PO TABS: 1000.0000 mg | ORAL_TABLET | ORAL | 0 refills | Status: DC | PRN

## 2022-10-03 NOTE — Progress Notes (Signed)
BP 115/65   Pulse 60   Temp 98.7 F (37.1 C)   Ht 6\' 2"  (1.88 m)   Wt 194 lb (88 kg)   SpO2 98%   BMI 24.91 kg/m    Subjective:   Patient ID: Manuel Kidd, male    DOB: Mar 18, 1950, 72 y.o.   MRN: [id number]  HPI: Tyriq Coulthard is a 72 y.o. male presenting on 10/03/2022 for Medical Management of Chronic Issues (3 month)   HPI Hyperlipidemia and aortic atherosclerosis Patient is coming in for recheck of his hyperlipidemia. The patient is currently taking fish oils. They deny any issues with myalgias or history of liver damage from it. They deny any focal numbness or weakness or chest pain.   Hypertension Patient is currently on no medicine currently, has been diet controlled, and their blood pressure today is 115/65. Patient denies any lightheadedness or dizziness. Patient denies headaches, blurred vision, chest pains, shortness of breath, or weakness. Denies any side effects from medication and is content with current medication.   BPH Patient is coming in for recheck on BPH Symptoms: None currently Medication: Alfuzosin Last PSA: Sees urology, saw them in April 2024  Relevant past medical, surgical, family and social history reviewed and updated as indicated. Interim medical history since our last visit reviewed. Allergies and medications reviewed and updated.  Review of Systems  Constitutional:  Negative for chills and fever.  Eyes:  Negative for visual disturbance.  Respiratory:  Negative for shortness of breath and wheezing.   Cardiovascular:  Negative for chest pain and leg swelling.  Musculoskeletal:  Negative for back pain and gait problem.  Skin:  Negative for rash.  Neurological:  Negative for dizziness, weakness and light-headedness.  All other systems reviewed and are negative.   Per HPI unless specifically indicated above   Allergies as of 10/03/2022   No Known Allergies      Medication List        Accurate as of October 03, 2022  8:41 AM. If you  have any questions, ask your nurse or doctor.          alfuzosin 10 MG 24 hr tablet Commonly known as: UROXATRAL Take 10 mg by mouth at bedtime.   cycloSPORINE 0.05 % ophthalmic emulsion Commonly known as: RESTASIS Place 1 drop into both eyes 2 (two) times daily.   Fish Oil 1000 MG Caps Take 1 capsule by mouth daily. 5 days a week   meloxicam 15 MG tablet Commonly known as: MOBIC Take 1 tablet (15 mg total) by mouth daily.   multivitamin tablet Take 1 tablet by mouth daily.   neomycin 500 MG tablet Commonly known as: MYCIFRADIN Take 500 mg by mouth once.   prednisoLONE acetate 0.12 % ophthalmic suspension Commonly known as: PRED MILD Place 1 drop into both eyes as needed.   valACYclovir 1000 MG tablet Commonly known as: VALTREX Take 1 tablet (1,000 mg total) by mouth as needed.   VITAMIN D (ERGOCALCIFEROL) PO Take 2,000 Units by mouth daily. 5 days a week         Objective:   BP 115/65   Pulse 60   Temp 98.7 F (37.1 C)   Ht 6\' 2"  (1.88 m)   Wt 194 lb (88 kg)   SpO2 98%   BMI 24.91 kg/m   Wt Readings from Last 3 Encounters:  10/03/22 194 lb (88 kg)  06/26/22 191 lb (86.6 kg)  11/25/21 193 lb (87.5 kg)    Physical Exam Vitals  and nursing note reviewed.  Constitutional:      General: He is not in acute distress.    Appearance: He is well-developed. He is not diaphoretic.  Eyes:     General: No scleral icterus.    Conjunctiva/sclera: Conjunctivae normal.  Neck:     Thyroid: No thyromegaly.  Cardiovascular:     Rate and Rhythm: Normal rate and regular rhythm.     Heart sounds: Normal heart sounds. No murmur heard. Pulmonary:     Effort: Pulmonary effort is normal. No respiratory distress.     Breath sounds: Normal breath sounds. No wheezing.  Musculoskeletal:        General: No swelling. Normal range of motion.     Cervical back: Neck supple.  Lymphadenopathy:     Cervical: No cervical adenopathy.  Skin:    General: Skin is warm and dry.      Findings: No rash.  Neurological:     Mental Status: He is alert and oriented to person, place, and time.     Coordination: Coordination normal.  Psychiatric:        Behavior: Behavior normal.       Assessment & Plan:   Problem List Items Addressed This Visit       Cardiovascular and Mediastinum   Hypertension   Relevant Orders   CMP14+EGFR   Aortic atherosclerosis (HCC)     Genitourinary   Benign prostatic hyperplasia     Other   Hyperlipidemia - Primary   Relevant Orders   CMP14+EGFR   Lipid panel    Will do blood work today, seems to be doing well, follow-up in 6 months Follow up plan: Return in about 6 months (around 04/02/2023), or if symptoms worsen or fail to improve, for Hypertension and hyperlipidemia.  Counseling provided for all of the vaccine components Orders Placed This Encounter  Procedures   CMP14+EGFR   Lipid panel    Arville Care, MD [facility] 10/03/2022, 8:41 AM

## 2022-10-04 LAB — LIPID PANEL
Chol/HDL Ratio: 3.9 ratio (ref 0.0–5.0)
Cholesterol, Total: 187 mg/dL (ref 100–199)
HDL: 48 mg/dL (ref >39)
LDL Chol Calc (NIH): 128 mg/dL — ABNORMAL HIGH (ref 0–99)
Triglycerides: 60 mg/dL (ref 0–149)
VLDL Cholesterol Cal: 11 mg/dL (ref 5–40)

## 2022-10-04 LAB — CMP14+EGFR
ALT: 15 IU/L (ref 0–44)
AST: 26 IU/L (ref 0–40)
Albumin: 4.4 g/dL (ref 3.8–4.8)
Alkaline Phosphatase: 70 IU/L (ref 44–121)
BUN/Creatinine Ratio: 14 (ref 10–24)
BUN: 13 mg/dL (ref 8–27)
Bilirubin Total: 0.7 mg/dL (ref 0.0–1.2)
CO2: 21 mmol/L (ref 20–29)
Calcium: 9.5 mg/dL (ref 8.6–10.2)
Chloride: 103 mmol/L (ref 96–106)
Creatinine, Ser: 0.91 mg/dL (ref 0.76–1.27)
Globulin, Total: 2.4 g/dL (ref 1.5–4.5)
Glucose: 89 mg/dL (ref 70–99)
Potassium: 4.4 mmol/L (ref 3.5–5.2)
Sodium: 142 mmol/L (ref 134–144)
Total Protein: 6.8 g/dL (ref 6.0–8.5)
eGFR: 90 mL/min/{1.73_m2} (ref >59)

## 2022-10-21 ENCOUNTER — Other Ambulatory Visit: Payer: Self-pay

## 2022-10-21 MED ORDER — ROSUVASTATIN CALCIUM 5 MG PO TABS: 5.0000 mg | ORAL_TABLET | Freq: Every evening | ORAL | 3 refills | Status: DC

## 2022-10-22 DIAGNOSIS — R931 Abnormal findings on diagnostic imaging of heart and coronary circulation: Secondary | ICD-10-CM | POA: Insufficient documentation

## 2022-10-22 NOTE — Progress Notes (Unsigned)
Cardiology Office Note   Date:  10/22/2022   ID:  Manuel Kidd, DOB 08-10-1950, MRN [id number]  PCP:  Dettinger, Elige Radon, MD  Cardiologist:   None Referring:  ***  No chief complaint on file.     History of Present Illness: Manuel Kidd is a 72 y.o. male who presents for evaluation of ***  I saw him in 2016 for evaluation of cardiovascular risk factors.  He had a mildly elevated coronary calcium score.  ***       who presents for evaluation of significant cardiovascular risk factors. I saw him about 10 years ago. He had a false-positive treadmill test with a negative stress perfusion test. This test was done because he had significant risk factors. Since that time the patient has done well. 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 With this he denies any cardiovascular symptoms other than mild SOB. The patient denies any new symptoms such as chest discomfort, neck or arm discomfort. There has been no PND or orthopnea. There have been no reported palpitations, presyncope or syncope.    Past Medical History:  Diagnosis Date   BPH (benign prostatic hyperplasia)    Difficult airway for intubation    per wife,during gallbladder sx   Hyperlipidemia    Vitamin D deficiency     Past Surgical History:  Procedure Laterality Date   CHOLECYSTECTOMY     HAND SURGERY Left      Current Outpatient Medications  Medication Sig Dispense Refill   alfuzosin (UROXATRAL) 10 MG 24 hr tablet Take 10 mg by mouth at bedtime.     cycloSPORINE (RESTASIS) 0.05 % ophthalmic emulsion Place 1 drop into both eyes 2 (two) times daily.     meloxicam (MOBIC) 15 MG tablet Take 1 tablet (15 mg total) by mouth daily. 30 tablet 3   Multiple Vitamin (MULTIVITAMIN) tablet Take 1 tablet by mouth daily.     neomycin (MYCIFRADIN) 500 MG tablet Take 500 mg by mouth once.     Omega-3 Fatty Acids (FISH OIL) 1000 MG CAPS Take 1 capsule by mouth daily. 5 days a week     prednisoLONE acetate (PRED MILD)  0.12 % ophthalmic suspension Place 1 drop into both eyes as needed.     rosuvastatin (CRESTOR) 5 MG tablet Take 1 tablet (5 mg total) by mouth at bedtime. 90 tablet 3   valACYclovir (VALTREX) 1000 MG tablet Take 1 tablet (1,000 mg total) by mouth as needed. 90 tablet 0   VITAMIN D, ERGOCALCIFEROL, PO Take 2,000 Units by mouth daily. 5 days a week     No current facility-administered medications for this visit.    Allergies:   Patient has no known allergies.    Social History:  The patient  reports that he has never smoked. He has never used smokeless tobacco. He reports that he does not drink alcohol and does not use drugs.   Family History:  The patient's ***family history includes CAD (age of onset: 62) in his brother; Colon polyps in his father and mother; Heart failure in his mother.    ROS:  Please see the history of present illness.   Otherwise, review of systems are positive for {NONE DEFAULTED:18576}.   All other systems are reviewed and negative.    PHYSICAL EXAM: VS:  There were no vitals taken for this visit. , BMI There is no height or weight on file to calculate BMI. GENERAL:  Well appearing HEENT:  Pupils  equal round and reactive, fundi not visualized, oral mucosa unremarkable NECK:  No jugular venous distention, waveform within normal limits, carotid upstroke brisk and symmetric, no bruits, no thyromegaly LYMPHATICS:  No cervical, inguinal adenopathy LUNGS:  Clear to auscultation bilaterally BACK:  No CVA tenderness CHEST:  Unremarkable HEART:  PMI not displaced or sustained,S1 and S2 within normal limits, no S3, no S4, no clicks, no rubs, *** murmurs ABD:  Flat, positive bowel sounds normal in frequency in pitch, no bruits, no rebound, no guarding, no midline pulsatile mass, no hepatomegaly, no splenomegaly EXT:  2 plus pulses throughout, no edema, no cyanosis no clubbing SKIN:  No rashes no nodules NEURO:  Cranial nerves II through XII grossly intact, motor grossly  intact throughout PSYCH:  Cognitively intact, oriented to person place and time    EKG:        Recent Labs: 06/26/2022: Hemoglobin 14.5; Platelets 177 10/03/2022: ALT 15; BUN 13; Creatinine, Ser 0.91; Potassium 4.4; Sodium 142    Lipid Panel    Component Value Date/Time   CHOL 187 10/03/2022 0937   CHOL 129 04/07/2012 0848   TRIG 60 10/03/2022 0937   TRIG 59 12/19/2015 0955   TRIG 59 04/07/2012 0848   HDL 48 10/03/2022 0937   HDL 49 12/19/2015 0955   HDL 39 (L) 04/07/2012 0848   CHOLHDL 3.9 10/03/2022 0937   LDLCALC 128 (H) 10/03/2022 0937   LDLCALC 125 (H) 08/25/2013 0948   LDLCALC 78 04/07/2012 0848      Wt Readings from Last 3 Encounters:  10/03/22 194 lb (88 kg)  06/26/22 191 lb (86.6 kg)  11/25/21 193 lb (87.5 kg)      Other studies Reviewed: Additional studies/ records that were reviewed today include: ***. Review of the above records demonstrates:  Please see elsewhere in the note.  ***   ASSESSMENT AND PLAN:  Elevated coronary calcium:  ***   Dyslipidemia:  ***   Current medicines are reviewed at length with the patient today.  The patient {ACTIONS; HAS/DOES NOT HAVE:19233} concerns regarding medicines.  The following changes have been made:  {PLAN; NO CHANGE:13088:s}  Labs/ tests ordered today include: *** No orders of the defined types were placed in this encounter.    Disposition:   FU with ***    Signed, Rollene Rotunda, MD  10/22/2022 9:52 PM    [facility]

## 2022-10-23 ENCOUNTER — Ambulatory Visit: Payer: Medicare PPO | Attending: Cardiology | Admitting: Cardiology

## 2022-10-23 ENCOUNTER — Encounter: Payer: Self-pay | Admitting: Cardiology

## 2022-10-23 VITALS — BP 132/72 | HR 57 | Ht 74.0 in | Wt 194.6 lb

## 2022-10-23 DIAGNOSIS — R931 Abnormal findings on diagnostic imaging of heart and coronary circulation: Secondary | ICD-10-CM | POA: Diagnosis not present

## 2022-10-23 DIAGNOSIS — E785 Hyperlipidemia, unspecified: Secondary | ICD-10-CM

## 2022-10-23 MED ORDER — PRAVASTATIN SODIUM 80 MG PO TABS
80.0000 mg | ORAL_TABLET | Freq: Every evening | ORAL | 3 refills | Status: DC
Start: 2022-10-23 — End: 2023-04-06

## 2022-10-23 NOTE — Patient Instructions (Signed)
Medication Instructions:   START PRAVASTATIN 60 MG ONCE DAILY  *If you need a refill on your cardiac medications before your next appointment, please call your pharmacy*   Lab Work:  Your physician recommends that you return for lab work in: 3 MONTHS-FASTING  If you have labs (blood work) drawn today and your tests are completely normal, you will receive your results only by: MyChart Message (if you have MyChart) OR A paper copy in the mail If you have any lab test that is abnormal or we need to change your treatment, we will call you to review the results.   Follow-Up: At [facility], you and your health needs are our priority.  As part of our continuing mission to provide you with exceptional heart care, we have created designated Provider Care Teams.  These Care Teams include your primary Cardiologist (physician) and Advanced Practice Providers (APPs -  Physician Assistants and Nurse Practitioners) who all work together to provide you with the care you need, when you need it.  We recommend signing up for the patient portal called "MyChart".  Sign up information is provided on this After Visit Summary.  MyChart is used to connect with patients for Virtual Visits (Telemedicine).  Patients are able to view lab/test results, encounter notes, upcoming appointments, etc.  Non-urgent messages can be sent to your provider as well.   To learn more about what you can do with MyChart, go to [email or web address].    Your next appointment:   12 month(s)  Provider:   Rollene Rotunda MD

## 2022-10-28 DIAGNOSIS — N401 Enlarged prostate with lower urinary tract symptoms: Secondary | ICD-10-CM | POA: Diagnosis not present

## 2022-11-11 DIAGNOSIS — D0461 Carcinoma in situ of skin of right upper limb, including shoulder: Secondary | ICD-10-CM | POA: Diagnosis not present

## 2022-11-11 DIAGNOSIS — L814 Other melanin hyperpigmentation: Secondary | ICD-10-CM | POA: Diagnosis not present

## 2022-11-11 DIAGNOSIS — D1801 Hemangioma of skin and subcutaneous tissue: Secondary | ICD-10-CM | POA: Diagnosis not present

## 2022-11-11 DIAGNOSIS — L821 Other seborrheic keratosis: Secondary | ICD-10-CM | POA: Diagnosis not present

## 2022-11-11 DIAGNOSIS — L570 Actinic keratosis: Secondary | ICD-10-CM | POA: Diagnosis not present

## 2022-11-11 DIAGNOSIS — D485 Neoplasm of uncertain behavior of skin: Secondary | ICD-10-CM | POA: Diagnosis not present

## 2022-11-11 DIAGNOSIS — L578 Other skin changes due to chronic exposure to nonionizing radiation: Secondary | ICD-10-CM | POA: Diagnosis not present

## 2023-01-01 ENCOUNTER — Other Ambulatory Visit: Payer: Self-pay | Admitting: Family Medicine

## 2023-01-01 DIAGNOSIS — C44622 Squamous cell carcinoma of skin of right upper limb, including shoulder: Secondary | ICD-10-CM | POA: Diagnosis not present

## 2023-01-30 ENCOUNTER — Ambulatory Visit (INDEPENDENT_AMBULATORY_CARE_PROVIDER_SITE_OTHER): Payer: Medicare PPO | Admitting: Physician Assistant

## 2023-01-30 ENCOUNTER — Other Ambulatory Visit (INDEPENDENT_AMBULATORY_CARE_PROVIDER_SITE_OTHER): Payer: Self-pay

## 2023-01-30 ENCOUNTER — Encounter: Payer: Self-pay | Admitting: Physician Assistant

## 2023-01-30 DIAGNOSIS — M25561 Pain in right knee: Secondary | ICD-10-CM

## 2023-01-30 MED ORDER — CEPHALEXIN 500 MG PO CAPS: 500.0000 mg | ORAL_CAPSULE | Freq: Four times a day (QID) | ORAL | 0 refills | Status: DC

## 2023-01-30 NOTE — Progress Notes (Signed)
Office Visit Note   Patient: Manuel Kidd           Date of Birth: Jun 01, 1950           MRN: [id number] Visit Date: 01/30/2023              Requested by: Nils Pyle, MD [address] [place],  [place] [address] PCP: Dettinger, Elige Radon, MD   Assessment & Plan: Visit Diagnoses:  1. Right knee pain, unspecified chronicity     Plan: Manuel Kidd is a pleasant 73 year old gentleman who comes in today with a complaint of right knee anterior pain and swelling.  Does describe that he has been doing a lot of baseboard work and working on his knees.  He began to notice pain especially across the front of his knee and swelling.  Denies any systemic symptoms such as fever chills or malaise.  Admits to the couple nights ago this was very sore even to touch.  It did get better yesterday.  X-rays reassuring.  Findings more consistent with a prepatellar bursitis.  He did say this is getting better.  The prepatellar bursa feels boggy.  He does have some mild erythema and swelling going down his leg.  I have recommended cleansing with Dial soap we will place him on a course of Keflex for the next 10 days.  Will follow-up with me in a week has been given strict return precautions  Follow-Up Instructions: Return in about 1 week (around 02/06/2023).   Orders:  Orders Placed This Encounter  Procedures   XR KNEE 3 VIEW RIGHT   Meds ordered this encounter  Medications   cephALEXin (KEFLEX) 500 MG capsule    Sig: Take 1 capsule (500 mg total) by mouth 4 (four) times daily.    Dispense:  40 capsule    Refill:  0      Procedures: No procedures performed   Clinical Data: No additional findings.   Subjective: Chief Complaint  Patient presents with   Right Knee - Pain    HPI Pleasant 73 year old gentleman comes in today with a chief complaint of right knee pain and swelling.  He has been doing quite a bit of kneeling work as he has been working on baseboards at his home.  He said it actually  is better today a couple days ago it was quite red and swollen and painful.  It improved yesterday.  He has not really been doing any specific treatment.  Denies any fever chills or malaise  Review of Systems  All other systems reviewed and are negative.    Objective: Vital Signs: There were no vitals taken for this visit.  Physical Exam Constitutional:      Appearance: Normal appearance.  Skin:    General: Skin is warm and dry.  Neurological:     General: No focal deficit present.     Mental Status: He is alert and oriented to person, place, and time.  Psychiatric:        Mood and Affect: Mood normal.        Behavior: Behavior normal.     Ortho Exam Examination of his right knee.  He has no effusion.  He does have bogginess within the prepatellar bursa and dry flaking skin.  No tenderness over the joint line good activation of his quadricep and patella tendons.  He does have some mild erythema extending down into his leg.  Slight soft tissue swelling no calf pain negative Denna Haggard' sign  he is neurovascularly intact Specialty Comments:  No specialty comments available.  Imaging: XR KNEE 3 VIEW RIGHT Result Date: 01/30/2023 Radiographs of his right knee were reviewed today no evidence of any acute fracture.  He does have some sclerotic changes    PMFS History: Patient Active Problem List   Diagnosis Date Noted   Pain in right knee 01/30/2023   Elevated coronary artery calcium score 10/22/2022   Hypertension 05/18/2020   Dupuytren contracture 06/21/2018   Abnormal CT scan of lung 10/11/2016   Aortic atherosclerosis (HCC) 11/16/2015   Solitary pulmonary nodule 06/20/2015   Hyperlipidemia 08/16/2012   Benign prostatic hyperplasia 08/16/2012   Vitamin D deficiency    Past Medical History:  Diagnosis Date   BPH (benign prostatic hyperplasia)    Difficult airway for intubation    per wife,during gallbladder sx   Hyperlipidemia    Vitamin D deficiency     Family History   Problem Relation Age of Onset   Colon polyps Mother    Heart failure Mother        died age 28   Colon polyps Father    CAD Brother 5       MIs   Colon cancer Neg Hx     Past Surgical History:  Procedure Laterality Date   CHOLECYSTECTOMY     HAND SURGERY Left    Social History   Occupational History   Occupation: Lexicographer:  [employer]  Tobacco Use   Smoking status: Never   Smokeless tobacco: Never  Vaping Use   Vaping status: Never Used  Substance and Sexual Activity   Alcohol use: No   Drug use: No   Sexual activity: Not on file

## 2023-02-02 ENCOUNTER — Ambulatory Visit (HOSPITAL_COMMUNITY)
Admission: RE | Admit: 2023-02-02 | Discharge: 2023-02-02 | Disposition: A | Discharge status: Home / Self Care | Payer: Medicare PPO | Source: Ambulatory Visit | Attending: Nurse Practitioner | Admitting: Nurse Practitioner

## 2023-02-02 ENCOUNTER — Ambulatory Visit: Payer: Medicare PPO | Admitting: Nurse Practitioner

## 2023-02-02 ENCOUNTER — Encounter: Payer: Self-pay | Admitting: Nurse Practitioner

## 2023-02-02 VITALS — BP 111/64 | HR 59 | Temp 97.4°F | Ht 74.0 in | Wt 195.0 lb

## 2023-02-02 DIAGNOSIS — M79604 Pain in right leg: Secondary | ICD-10-CM | POA: Insufficient documentation

## 2023-02-02 NOTE — Progress Notes (Signed)
Acute Office Visit  Subjective:     Patient ID: Manuel Kidd, male    DOB: 1950-07-07, 73 y.o.   MRN: [id number]  Chief Complaint  Patient presents with   Leg Swelling    Left leg swollen, red, and sore for almost a week, went to ortho Friday thought it was fluid on knee was given cephalexin     HPI Manuel Kidd is a 73 yrs old male present 02/02/2023 for an acute visit for right knee pain. The patient is a 73 year old male presenting for an acute visit due to right knee pain. He reports that he was seen at [facility] center 01/29/2022 for right knee pain and was prescribed Keflex after an x-ray showed no evidence of fracture. Over the weekend, the patient developed increased pain and swelling in the right leg. He denies fever, chills, or malaise. Given the new symptoms of leg edema, a duplex ultrasound will be ordered to rule out deep vein thrombosis (DVT). Active Ambulatory Problems    Diagnosis Date Noted   Vitamin D deficiency    Hyperlipidemia 08/16/2012   Benign prostatic hyperplasia 08/16/2012   Solitary pulmonary nodule 06/20/2015   Aortic atherosclerosis (HCC) 11/16/2015   Abnormal CT scan of lung 10/11/2016   Dupuytren contracture 06/21/2018   Hypertension 05/18/2020   Elevated coronary artery calcium score 10/22/2022   Pain in right knee 01/30/2023   Resolved Ambulatory Problems    Diagnosis Date Noted   Difficult airway for intubation    Other and unspecified hyperlipidemia 03/02/2012   Thrombocytopenia (HCC) 06/21/2015   Pain of left hand 06/21/2018   Past Medical History:  Diagnosis Date   BPH (benign prostatic hyperplasia)     ROS Negative unless indicated in HPI    Objective:    BP 111/64   Pulse (!) 59   Temp (!) 97.4 F (36.3 C) (Temporal)   Ht 6\' 2"  (1.88 m)   Wt 195 lb (88.5 kg)   SpO2 97%   BMI 25.04 kg/m  BP Readings from Last 3 Encounters:  02/02/23 111/64  10/23/22 132/72  10/03/22 115/65   Wt Readings from Last 3 Encounters:   02/02/23 195 lb (88.5 kg)  10/23/22 194 lb 9.6 oz (88.3 kg)  10/03/22 194 lb (88 kg)      Physical Exam Vitals and nursing note reviewed.  Constitutional:      General: He is not in acute distress. HENT:     Head: Normocephalic and atraumatic.     Nose: Nose normal.     Mouth/Throat:     Mouth: Mucous membranes are moist.  Eyes:     General: No scleral icterus.    Extraocular Movements: Extraocular movements intact.     Conjunctiva/sclera: Conjunctivae normal.     Pupils: Pupils are equal, round, and reactive to light.  Cardiovascular:     Rate and Rhythm: Normal rate and regular rhythm.  Pulmonary:     Effort: Pulmonary effort is normal.     Breath sounds: Normal breath sounds.  Musculoskeletal:     Right lower leg: Tenderness present. 1+ Edema present.     Left lower leg: Normal. No tenderness or bony tenderness. No edema.  Skin:    General: Skin is warm and dry.     Findings: No rash.  Neurological:     Mental Status: He is alert and oriented to person, place, and time. Mental status is at baseline.  Psychiatric:        Mood and Affect: Mood  normal.        Behavior: Behavior normal.        Thought Content: Thought content normal.        Judgment: Judgment normal.     No results found for any visits on 02/02/23.      Assessment & Plan:  Acute leg pain, right -     US Venous Img Lower Unilateral Right (DVT); Future    Manuel Kidd is 73 yrs old Caucasian male seen today for right leg edema Right leg pain STAT US: Negative venous Doppler examination for right lower extremity DVT.  Continue the course of ATB prescribed By [facility] until done, wear knee pads for activity that involved kneeling.  Encourage  healthy lifestyle choices, including diet (rich in fruits, vegetables, and lean proteins, and low in salt and simple carbohydrates) and exercise (at least 30 minutes of moderate physical activity daily).     The above assessment and management plan was discussed  with the patient. The patient verbalized understanding of and has agreed to the management plan. Patient is aware to call the clinic if they develop any new symptoms or if symptoms persist or worsen. Patient is aware when to return to the clinic for a follow-up visit. Patient educated on when it is appropriate to go to the emergency department.   Return if symptoms worsen or fail to improve.  Manuel Kidd Santa Lighter, [place] [facility] [address] [place], [place] [address] [phone]  Note: This document was prepared by Reubin Milan voice dictation technology and any errors that results from this process are unintentional.

## 2023-02-06 ENCOUNTER — Ambulatory Visit: Payer: Medicare PPO | Admitting: Physician Assistant

## 2023-02-10 ENCOUNTER — Ambulatory Visit: Payer: Self-pay | Admitting: Family Medicine

## 2023-02-10 NOTE — Telephone Encounter (Signed)
Chief Complaint: leg swelling Symptoms: swollen and red Frequency: constant  Disposition: [] ED /[] Urgent Care (no appt availability in office) / [x] Appointment(In office/virtual)/ []  [facility]/ [] Home Care/ [] Refused Recommended Disposition /[] [facility]/ []  Follow-up with PCP Additional Notes: Pt's wife Para March called due to pt still  has some swelling/redness in right leg since 1/10. Swelling started in right knee but traveled down to foot. Pt went to Orthopaedist on 1/17 and was prescribed an antibiotic for 10 days. Provider felt it was a leg infection from working. On 1/20, leg did not appear to be improving so pt sought out another provider at [facility] where an ultrasound was ordered and DVT ws ruled out. Pt was told to continue antibiotic.  Antibiotic as finished yesterday. Para March says it is not warm to touch, swelling is down, and only a "tad shade of red." Pt denies any pain, SOB, weakness, or difficulty walking.  Per protocol, pt to be seen within 3 days. Pt has appt on 1/30 with PCP. RN gave care advice and Para March verbalized understanding.          Copied from CRM [id number]. Topic: Clinical - Red Word Triage >> Feb 10, 2023 10:47 AM Elle L wrote: Red Word that prompted transfer to Nurse Triage: The patient was seen in the office for his leg as it is red, swelling, and in pain and it was thought to be DVT. He had an ultrasound that ruled this out and he finished his course of medications. However, his leg is swollen, red, and in pain still. Reason for Disposition  [1] MILD swelling of both ankles (i.e., pedal edema) AND [2] new-onset or worsening  Answer Assessment - Initial Assessment Questions 1. ONSET: "When did the swelling start?" (e.g., minutes, hours, days)     1/10 2. LOCATION: "What part of the leg is swollen?"  "Are both legs swollen or just one leg?"     Right-calf 3. SEVERITY: "How bad is the swelling?" (e.g., localized; mild, moderate,  severe)   - Localized: Small area of swelling localized to one leg.   - MILD pedal edema: Swelling limited to foot and ankle, pitting edema < 1/4 inch (6 mm) deep, rest and elevation eliminate most or all swelling.   - MODERATE edema: Swelling of lower leg to knee, pitting edema > 1/4 inch (6 mm) deep, rest and elevation only partially reduce swelling.   - SEVERE edema: Swelling extends above knee, facial or hand swelling present.      mild 4. REDNESS: "Does the swelling look red or infected?"     Slight red-near calf 5. PAIN: "Is the swelling painful to touch?" If Yes, ask: "How painful is it?"   (Scale 1-10; mild, moderate or severe)     Denies pain 6. FEVER: "Do you have a fever?" If Yes, ask: "What is it, how was it measured, and when did it start?"      No  7. CAUSE: "What do you think is causing the leg swelling?"     Leg infection  8. MEDICAL HISTORY: "Do you have a history of blood clots (e.g., DVT), cancer, heart failure, kidney disease, or liver failure?"     Denies  9. RECURRENT SYMPTOM: "Have you had leg swelling before?" If Yes, ask: "When was the last time?" "What happened that time?"     Denies  10. OTHER SYMPTOMS: "Do you have any other symptoms?" (e.g., chest pain, difficulty breathing)       Denies  Protocols used: Leg Swelling and Edema-A-AH

## 2023-02-12 ENCOUNTER — Ambulatory Visit: Payer: Medicare PPO | Admitting: Family Medicine

## 2023-02-12 ENCOUNTER — Encounter: Payer: Self-pay | Admitting: Family Medicine

## 2023-02-12 VITALS — BP 125/73 | Ht 74.0 in | Wt 193.0 lb

## 2023-02-12 DIAGNOSIS — M7041 Prepatellar bursitis, right knee: Secondary | ICD-10-CM | POA: Diagnosis not present

## 2023-02-12 NOTE — Progress Notes (Signed)
BP 125/73   Ht 6\' 2"  (1.88 m)   Wt 193 lb (87.5 kg)   SpO2 96%   BMI 24.78 kg/m    Subjective:   Patient ID: Manuel Kidd, male    DOB: 04/07/1950, 73 y.o.   MRN: [id number]  HPI: Manuel Kidd is a 73 y.o. male presenting on 02/12/2023 for Edema (RLE)   HPI Comes in today for re-assessment of right lower leg edema. He was last seen 10 days ago for right leg pain and sent for duplex ultrasound to check for DVT, which was negative. He feels the swelling has gone down and doesn't note any wounds. He finished taking Cephalexin 3 days ago, which was prescribed by [facility] that he saw on 01/30/2023 for right knee pain. He reports he continued doing work that required he be on his knees to long periods of time over the past week which caused him some discomfort. He tried a muscle relaxer which had no improvement and Aleve which did provide relief. He denies any fever or chills  Relevant past medical, surgical, family and social history reviewed and updated as indicated. Interim medical history since our last visit reviewed. Allergies and medications reviewed and updated.  Review of Systems  Constitutional:  Negative for chills, fatigue and fever.  Respiratory:  Negative for chest tightness and shortness of breath.   Cardiovascular:  Positive for leg swelling. Negative for chest pain.  Musculoskeletal:  Negative for myalgias.  Skin:  Negative for rash.  Neurological:  Negative for numbness.  Psychiatric/Behavioral:  Negative for agitation.   All other systems reviewed and are negative.   Per HPI unless specifically indicated above   Allergies as of 02/12/2023       Reactions   Crestor [rosuvastatin] Other (See Comments)   Muscle pain   Lipitor [atorvastatin]    Muscle pain        Medication List        Accurate as of February 12, 2023  2:02 PM. If you have any questions, ask your nurse or doctor.          STOP taking these medications    cephALEXin 500 MG  capsule Commonly known as: KEFLEX Stopped by: Elige Radon Mckynna Vanloan   meloxicam 15 MG tablet Commonly known as: MOBIC Stopped by: Elige Radon Arron Tetrault   neomycin 500 MG tablet Commonly known as: MYCIFRADIN Stopped by: Elige Radon Perez Dirico   rosuvastatin 5 MG tablet Commonly known as: Crestor Stopped by: Elige Radon Lakeita Panther       TAKE these medications    alfuzosin 10 MG 24 hr tablet Commonly known as: UROXATRAL Take 10 mg by mouth at bedtime.   cycloSPORINE 0.05 % ophthalmic emulsion Commonly known as: RESTASIS Place 1 drop into both eyes 2 (two) times daily.   Fish Oil 1000 MG Caps Take 1 capsule by mouth daily. 5 days a week   multivitamin tablet Take 1 tablet by mouth daily.   pravastatin 80 MG tablet Commonly known as: PRAVACHOL Take 1 tablet (80 mg total) by mouth every evening.   prednisoLONE acetate 0.12 % ophthalmic suspension Commonly known as: PRED MILD Place 1 drop into both eyes as needed.   valACYclovir 1000 MG tablet Commonly known as: VALTREX TAKE 1 TABLET (1,000 MG TOTAL) BY MOUTH AS NEEDED   VITAMIN D (ERGOCALCIFEROL) PO Take 2,000 Units by mouth daily. 5 days a week         Objective:   BP 125/73   Ht 6\' 2"  (  1.88 m)   Wt 193 lb (87.5 kg)   SpO2 96%   BMI 24.78 kg/m   Wt Readings from Last 3 Encounters:  02/12/23 193 lb (87.5 kg)  02/02/23 195 lb (88.5 kg)  10/23/22 194 lb 9.6 oz (88.3 kg)    Physical Exam Vitals reviewed.  Constitutional:      General: He is not in acute distress.    Appearance: Normal appearance.  Cardiovascular:     Rate and Rhythm: Normal rate and regular rhythm.  Pulmonary:     Effort: Pulmonary effort is normal. No respiratory distress.  Musculoskeletal:     Right knee: Swelling and effusion present. No erythema or crepitus. Normal range of motion. No tenderness.     Right lower leg: No tenderness. 1+ Pitting Edema present.     Right ankle: Normal.  Skin:    General: Skin is warm and dry.  Neurological:      Mental Status: He is alert.  Psychiatric:        Behavior: Behavior normal.       Assessment & Plan:   Problem List Items Addressed This Visit   None Visit Diagnoses       Prepatellar bursitis of right knee    -  Primary       Diagnosis is prepatellar bursitis of the Right knee. His right lower leg edema appears to be doing better with less edema than prior visit. He does admit to be working while on his knees without using knee pads or anything to cushion the floor for the past week. The continued pressure and action of being on his knee is making the bursa flare up. Recommended he continue to take Aleve or another NSAID daily for the next two weeks to help with the inflammation and to use ice packs on his knee. Also discussed the use of knee pads or anything to soften the floor if he intends to be working while on his knees and to take breaks when he feels his knees are starting to feel sore.   Follow up plan: Return if symptoms worsen or fail to improve.  Counseling provided for all of the vaccine components No orders of the defined types were placed in this encounter.   Maximino Sarin PA-S [facility] 02/12/2023, 2:02 PM  I was personally present for all components of the history, physical exam and/or medical decision making.  I agree with the documentation performed by the student and agree with assessment and plan above.  Recommended to avoid working on his knees until it is better.  Recommended Aleve twice daily with food and ice twice daily for 15 minutes if possible. Arville Care, MD [facility] 02/12/2023, 2:02 PM

## 2023-02-18 ENCOUNTER — Encounter: Payer: Self-pay | Admitting: *Deleted

## 2023-03-05 ENCOUNTER — Encounter: Payer: Self-pay | Admitting: Cardiology

## 2023-03-09 NOTE — Telephone Encounter (Signed)
 I would suggest that he attend the [facility] Clinic if he is willing.  Thanks. Rollene Rotunda, MD t 03/08/23 12:51 PM

## 2023-03-16 NOTE — Telephone Encounter (Signed)
 Called and left detailed VMM to patient explaining that he has not seen messages from this office on his my chart account. Also instructed him to call office if he would like the referral to the lipid clinic as requested by Dr Ottawa Lions.

## 2023-04-06 ENCOUNTER — Ambulatory Visit: Admitting: Family Medicine

## 2023-04-06 ENCOUNTER — Encounter: Payer: Self-pay | Admitting: Family Medicine

## 2023-04-06 VITALS — BP 131/77 | HR 63 | Ht 74.0 in | Wt 195.0 lb

## 2023-04-06 DIAGNOSIS — M722 Plantar fascial fibromatosis: Secondary | ICD-10-CM

## 2023-04-06 NOTE — Progress Notes (Signed)
 BP 131/77   Pulse 63   Ht 6\' 2"  (1.88 m)   Wt 195 lb (88.5 kg)   SpO2 97%   BMI 25.04 kg/m    Subjective:   Patient ID: Manuel Kidd, male    DOB: [birth year], 73 y.o.   MRN: [id number]  HPI: Manuel Kidd is a 73 y.o. male presenting on 04/06/2023 for Foot Pain (Right heel)   HPI Patient is coming in today with complaints of right heel pain.  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  He says he wakes up in the morning and his first few steps feel a lot worse and that is it calms down it turns into an ache and only hurts and sometimes if you make certain movements throughout the day.  It does hurt on the bottom of his heel and the flat part of his foot.  He has never had this before.  He has used a little bit of Voltaren gel but did not seem to help.  Relevant past medical, surgical, family and social history reviewed and updated as indicated. Interim medical history since our last visit reviewed. Allergies and medications reviewed and updated.  Review of Systems  Constitutional:  Negative for chills and fever.  Eyes:  Negative for visual disturbance.  Respiratory:  Negative for shortness of breath and wheezing.   Cardiovascular:  Negative for chest pain and leg swelling.  Musculoskeletal:  Positive for arthralgias and myalgias. Negative for back pain and gait problem.  Skin:  Negative for rash.  All other systems reviewed and are negative.   Per HPI unless specifically indicated above   Allergies as of 04/06/2023       Reactions   Crestor [rosuvastatin] Other (See Comments)   Muscle pain   Lipitor [atorvastatin]    Muscle pain        Medication List        Accurate as of April 06, 2023  3:25 PM. If you have any questions, ask your nurse or doctor.          STOP taking these medications    pravastatin 80 MG  tablet Commonly known as: PRAVACHOL Stopped by: Elige Radon Verdon Ferrante       TAKE these medications    alfuzosin 10 MG 24 hr tablet Commonly known as: UROXATRAL Take 10 mg by mouth at bedtime.   cycloSPORINE 0.05 % ophthalmic emulsion Commonly known as: RESTASIS Place 1 drop into both eyes 2 (two) times daily.   Fish Oil 1000 MG Caps Take 1 capsule by mouth daily. 5 days a week   multivitamin tablet Take 1 tablet by mouth daily.   prednisoLONE acetate 0.12 % ophthalmic suspension Commonly known as: PRED MILD Place 1 drop into both eyes as needed.   valACYclovir 1000 MG tablet Commonly known as: VALTREX TAKE 1 TABLET (1,000 MG TOTAL) BY MOUTH AS NEEDED   VITAMIN D (ERGOCALCIFEROL) PO Take 2,000 Units by mouth daily. 5 days a week         Objective:   BP 131/77   Pulse 63   Ht 6\' 2"  (1.88 m)   Wt 195 lb (88.5 kg)   SpO2 97%   BMI 25.04 kg/m   Wt Readings from Last 3 Encounters:  04/06/23 195 lb (88.5 kg)  02/12/23 193 lb (87.5  kg)  02/02/23 195 lb (88.5 kg)    Physical Exam Vitals and nursing note reviewed.  Constitutional:      Appearance: Normal appearance.  Musculoskeletal:       Feet:  Feet:     Right foot:     Skin integrity: No ulcer, skin breakdown, erythema, warmth or callus.  Neurological:     Mental Status: He is alert.       Assessment & Plan:   Problem List Items Addressed This Visit   None Visit Diagnoses       Plantar fasciitis of right foot    -  Primary       Recommended to use anti-inflammatories and ice it and use good footwear and then let us know in the future if is not better and we can do an injection Follow up plan: Return if symptoms worsen or fail to improve.  Counseling provided for all of the vaccine components No orders of the defined types were placed in this encounter.   Arville Care, MD [facility] 04/06/2023, 3:25 PM

## 2023-04-17 ENCOUNTER — Ambulatory Visit: Payer: Medicare PPO | Admitting: Family Medicine

## 2023-04-17 ENCOUNTER — Encounter: Payer: Self-pay | Admitting: Family Medicine

## 2023-04-17 VITALS — BP 125/70 | HR 57 | Ht 74.0 in | Wt 193.0 lb

## 2023-04-17 DIAGNOSIS — E782 Mixed hyperlipidemia: Secondary | ICD-10-CM | POA: Diagnosis not present

## 2023-04-17 DIAGNOSIS — N400 Enlarged prostate without lower urinary tract symptoms: Secondary | ICD-10-CM

## 2023-04-17 DIAGNOSIS — I700 Atherosclerosis of aorta: Secondary | ICD-10-CM

## 2023-04-17 DIAGNOSIS — I10 Essential (primary) hypertension: Secondary | ICD-10-CM

## 2023-04-17 NOTE — Progress Notes (Signed)
 BP 125/70   Pulse (!) 57   Ht 6\' 2"  (1.88 m)   Wt 193 lb (87.5 kg)   SpO2 98%   BMI 24.78 kg/m    Subjective:   Patient ID: Manuel Kidd, male    DOB: December 20, 1950, 73 y.o.   MRN: [id number]  HPI: Manuel Kidd is a 73 y.o. male presenting on 04/17/2023 for Medical Management of Chronic Issues and Hyperlipidemia   HPI Hyperlipidemia and aortic atherosclerosis Patient is coming in for recheck of his hyperlipidemia. The patient is currently taking fish oils, has tried a couple statins and did not do well with them. They deny any issues with myalgias or history of liver damage from it. They deny any focal numbness or weakness or chest pain.   Hypertension Patient is currently on no medicine currently, diet controlled, and their blood pressure today is 125/70. Patient denies any lightheadedness or dizziness. Patient denies headaches, blurred vision, chest pains, shortness of breath, or weakness. Denies any side effects from medication and is content with current medication.   BPH Patient is coming in for recheck on BPH Symptoms: Some decreased stream, sees urology Medication: Alfuzosin Last PSA: Sees urology every 6 months  Relevant past medical, surgical, family and social history reviewed and updated as indicated. Interim medical history since our last visit reviewed. Allergies and medications reviewed and updated.  Review of Systems  Constitutional:  Negative for chills and fever.  HENT:  Negative for ear pain and tinnitus.   Eyes:  Negative for pain and discharge.  Respiratory:  Negative for cough, shortness of breath and wheezing.   Cardiovascular:  Negative for chest pain, palpitations and leg swelling.  Gastrointestinal:  Negative for abdominal pain, blood in stool, constipation and diarrhea.  Genitourinary:  Negative for dysuria and hematuria.  Musculoskeletal:  Negative for back pain, gait problem and myalgias.  Skin:  Negative for rash.  Neurological:  Negative for  dizziness, weakness and headaches.  Psychiatric/Behavioral:  Negative for suicidal ideas.   All other systems reviewed and are negative.   Per HPI unless specifically indicated above   Allergies as of 04/17/2023       Reactions   Crestor [rosuvastatin] Other (See Comments)   Muscle pain   Lipitor [atorvastatin]    Muscle pain        Medication List        Accurate as of April 17, 2023  8:35 AM. If you have any questions, ask your nurse or doctor.          alfuzosin 10 MG 24 hr tablet Commonly known as: UROXATRAL Take 10 mg by mouth at bedtime.   cycloSPORINE 0.05 % ophthalmic emulsion Commonly known as: RESTASIS Place 1 drop into both eyes 2 (two) times daily.   Fish Oil 1000 MG Caps Take 1 capsule by mouth daily. 5 days a week   multivitamin tablet Take 1 tablet by mouth daily.   prednisoLONE acetate 0.12 % ophthalmic suspension Commonly known as: PRED MILD Place 1 drop into both eyes as needed.   valACYclovir 1000 MG tablet Commonly known as: VALTREX TAKE 1 TABLET (1,000 MG TOTAL) BY MOUTH AS NEEDED   VITAMIN D (ERGOCALCIFEROL) PO Take 2,000 Units by mouth daily. 5 days a week         Objective:   BP 125/70   Pulse (!) 57   Ht 6\' 2"  (1.88 m)   Wt 193 lb (87.5 kg)   SpO2 98%   BMI 24.78 kg/m  Wt Readings from Last 3 Encounters:  04/17/23 193 lb (87.5 kg)  04/06/23 195 lb (88.5 kg)  02/12/23 193 lb (87.5 kg)    Physical Exam Vitals and nursing note reviewed.  Constitutional:      General: He is not in acute distress.    Appearance: He is well-developed. He is not diaphoretic.  Eyes:     General: No scleral icterus.    Conjunctiva/sclera: Conjunctivae normal.  Neck:     Thyroid: No thyromegaly.  Cardiovascular:     Rate and Rhythm: Normal rate and regular rhythm.     Heart sounds: Normal heart sounds. No murmur heard. Pulmonary:     Effort: Pulmonary effort is normal. No respiratory distress.     Breath sounds: Normal breath sounds.  No wheezing.  Musculoskeletal:        General: No swelling. Normal range of motion.     Cervical back: Neck supple.  Lymphadenopathy:     Cervical: No cervical adenopathy.  Skin:    General: Skin is warm and dry.     Findings: No rash.  Neurological:     Mental Status: He is alert and oriented to person, place, and time.     Coordination: Coordination normal.  Psychiatric:        Behavior: Behavior normal.       Assessment & Plan:   Problem List Items Addressed This Visit       Cardiovascular and Mediastinum   Hypertension   Relevant Orders   CBC with Differential/Platelet   CMP14+EGFR   Lipid panel   Aortic atherosclerosis (HCC)   Relevant Orders   CBC with Differential/Platelet   CMP14+EGFR   Lipid panel     Genitourinary   Benign prostatic hyperplasia     Other   Hyperlipidemia - Primary   Relevant Orders   CBC with Differential/Platelet   CMP14+EGFR   Lipid panel    Will check blood work today, everything else looks like he is doing well.  He continues to see urology. Follow up plan: Return in about 6 months (around 10/17/2023), or if symptoms worsen or fail to improve, for Physical exam and hypertension and cholesterol recheck.  Counseling provided for all of the vaccine components Orders Placed This Encounter  Procedures   CBC with Differential/Platelet   CMP14+EGFR   Lipid panel    Arville Care, MD [facility] 04/17/2023, 8:35 AM

## 2023-04-18 LAB — LIPID PANEL
Chol/HDL Ratio: 3.7 ratio (ref 0.0–5.0)
Cholesterol, Total: 182 mg/dL (ref 100–199)
HDL: 49 mg/dL (ref >39)
LDL Chol Calc (NIH): 120 mg/dL — ABNORMAL HIGH (ref 0–99)
Triglycerides: 69 mg/dL (ref 0–149)
VLDL Cholesterol Cal: 13 mg/dL (ref 5–40)

## 2023-04-18 LAB — CMP14+EGFR
ALT: 14 IU/L (ref 0–44)
AST: 25 IU/L (ref 0–40)
Albumin: 4.4 g/dL (ref 3.8–4.8)
Alkaline Phosphatase: 71 IU/L (ref 44–121)
BUN/Creatinine Ratio: 17 (ref 10–24)
BUN: 16 mg/dL (ref 8–27)
Bilirubin Total: 0.7 mg/dL (ref 0.0–1.2)
CO2: 24 mmol/L (ref 20–29)
Calcium: 9.4 mg/dL (ref 8.6–10.2)
Chloride: 103 mmol/L (ref 96–106)
Creatinine, Ser: 0.94 mg/dL (ref 0.76–1.27)
Globulin, Total: 2.3 g/dL (ref 1.5–4.5)
Glucose: 88 mg/dL (ref 70–99)
Potassium: 4.3 mmol/L (ref 3.5–5.2)
Sodium: 141 mmol/L (ref 134–144)
Total Protein: 6.7 g/dL (ref 6.0–8.5)
eGFR: 86 mL/min/{1.73_m2} (ref >59)

## 2023-04-18 LAB — CBC WITH DIFFERENTIAL/PLATELET
Basophils Absolute: 0.1 10*3/uL (ref 0.0–0.2)
Basos: 1 %
EOS (ABSOLUTE): 0.3 10*3/uL (ref 0.0–0.4)
Eos: 6 %
Hematocrit: 46.9 % (ref 37.5–51.0)
Hemoglobin: 15.3 g/dL (ref 13.0–17.7)
Immature Grans (Abs): 0 10*3/uL (ref 0.0–0.1)
Immature Granulocytes: 0 %
Lymphocytes Absolute: 1.1 10*3/uL (ref 0.7–3.1)
Lymphs: 22 %
MCH: 28 pg (ref 26.6–33.0)
MCHC: 32.6 g/dL (ref 31.5–35.7)
MCV: 86 fL (ref 79–97)
Monocytes Absolute: 0.3 10*3/uL (ref 0.1–0.9)
Monocytes: 6 %
Neutrophils Absolute: 3.1 10*3/uL (ref 1.4–7.0)
Neutrophils: 65 %
Platelets: 156 10*3/uL (ref 150–450)
RBC: 5.47 x10E6/uL (ref 4.14–5.80)
RDW: 12.7 % (ref 11.6–15.4)
WBC: 4.8 10*3/uL (ref 3.4–10.8)

## 2023-04-21 DIAGNOSIS — R972 Elevated prostate specific antigen [PSA]: Secondary | ICD-10-CM | POA: Diagnosis not present

## 2023-04-21 DIAGNOSIS — N401 Enlarged prostate with lower urinary tract symptoms: Secondary | ICD-10-CM | POA: Diagnosis not present

## 2023-04-24 ENCOUNTER — Encounter: Payer: Self-pay | Admitting: Family Medicine

## 2023-04-28 DIAGNOSIS — R399 Unspecified symptoms and signs involving the genitourinary system: Secondary | ICD-10-CM | POA: Diagnosis not present

## 2023-04-28 DIAGNOSIS — N401 Enlarged prostate with lower urinary tract symptoms: Secondary | ICD-10-CM | POA: Diagnosis not present

## 2023-05-27 IMAGING — MR MR SHOULDER*R* W/O CM
4 of 6 series · 21 of 40 positions shown · non-contrast
Comparison: Shoulder radiograph 11/19/2020

CLINICAL DATA: Shoulder pain, labral tear suspected, xray done r/o
RCT

EXAM:
MRI OF THE RIGHT SHOULDER WITHOUT CONTRAST
TECHNIQUE: Multiplanar, multisequence MR imaging of the shoulder was performed.
No intravenous contrast was administered.

[Series 6: T2 fat-sat · axial · right · 3.0mm · 0.47mm/px · z∈[-46,+59]mm · 9 of 28 slices shown (1 of 3)]
[im 1/28]
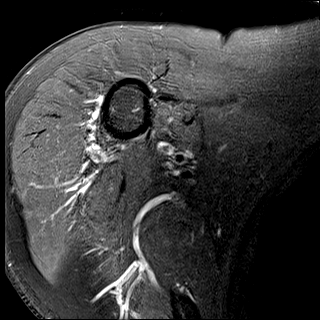
[im 4/28]
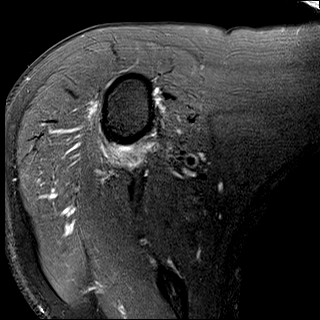
[im 7/28]
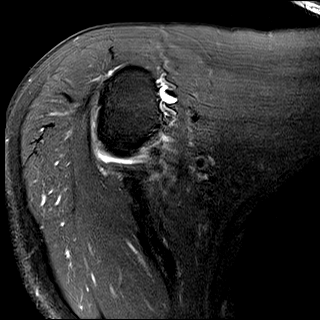
[im 11/28]
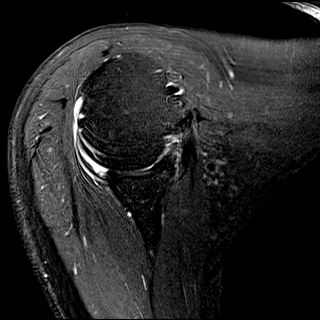
[im 14/28]
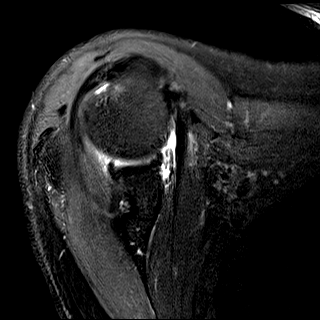
[im 17/28]
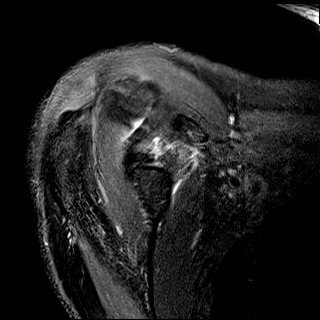
[im 21/28]
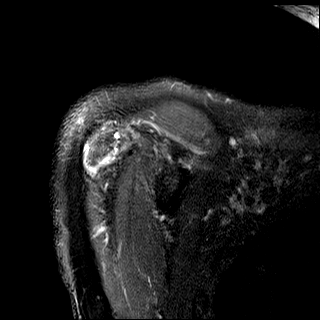
[im 24/28]
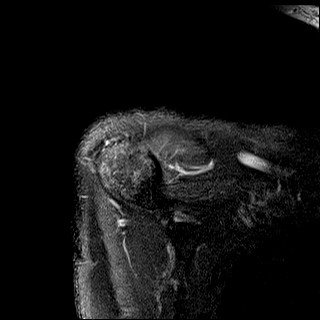
[im 28/28]
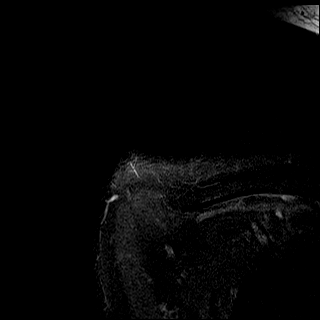

[Series 7: T2 fat-sat · oblique · right · 4.0mm · 0.22mm/px · 4 of 21 slices shown (2 of 3)]
[im 1/21]
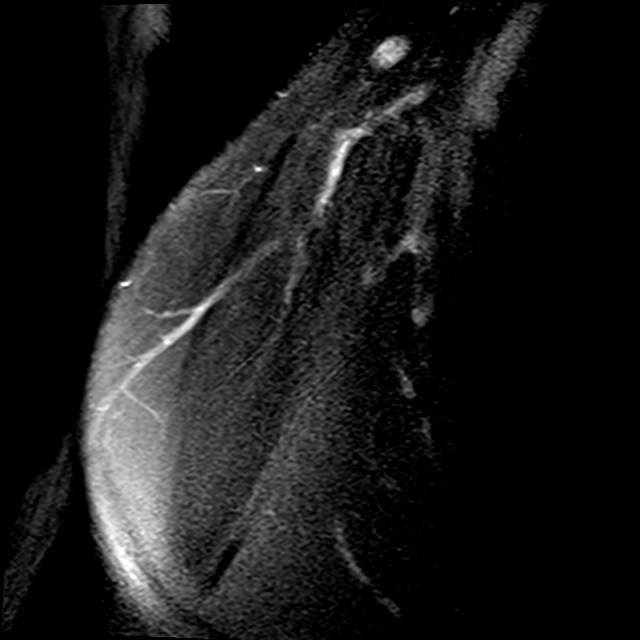
[im 5/21]
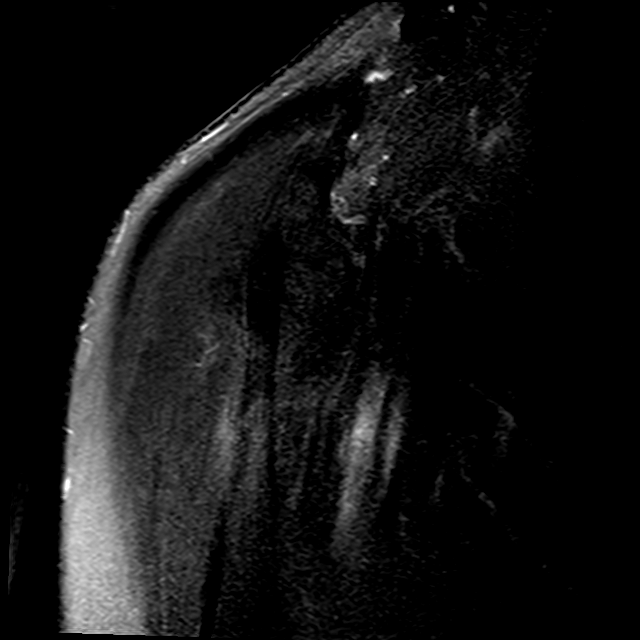
[im 13/21]
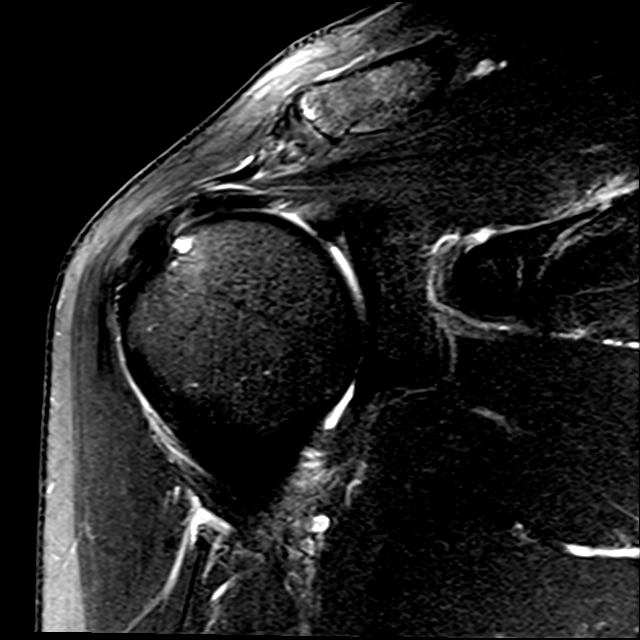
[im 21/21]
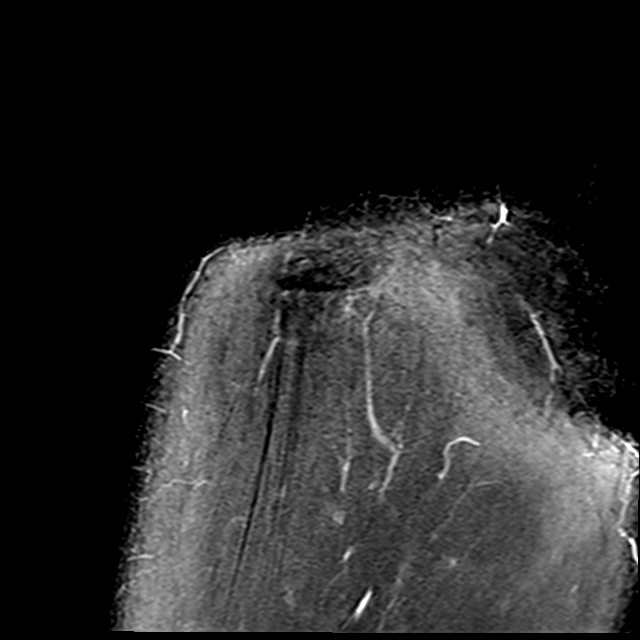

[Series 8: PD · oblique · right · 4.0mm · 0.22mm/px · 5 of 19 slices shown]
[im 1/19]
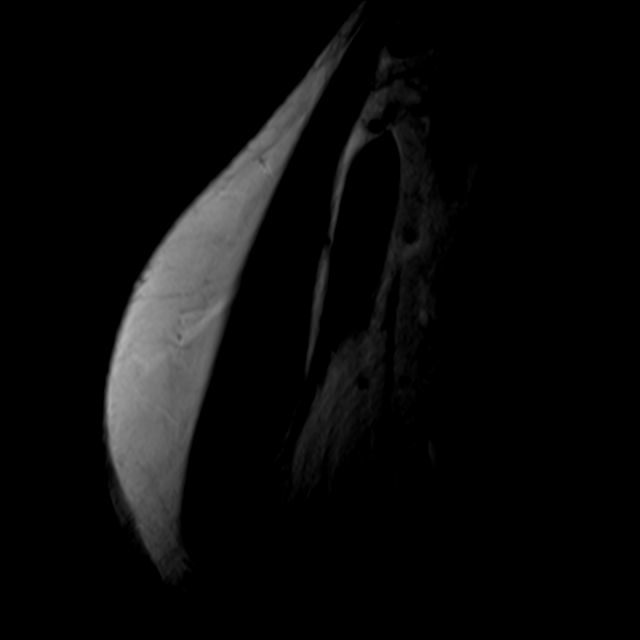
[im 5/19]
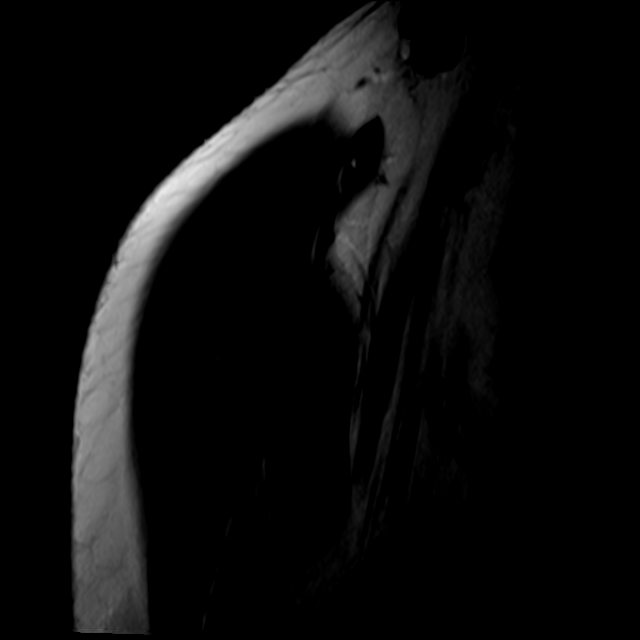
[im 10/19]
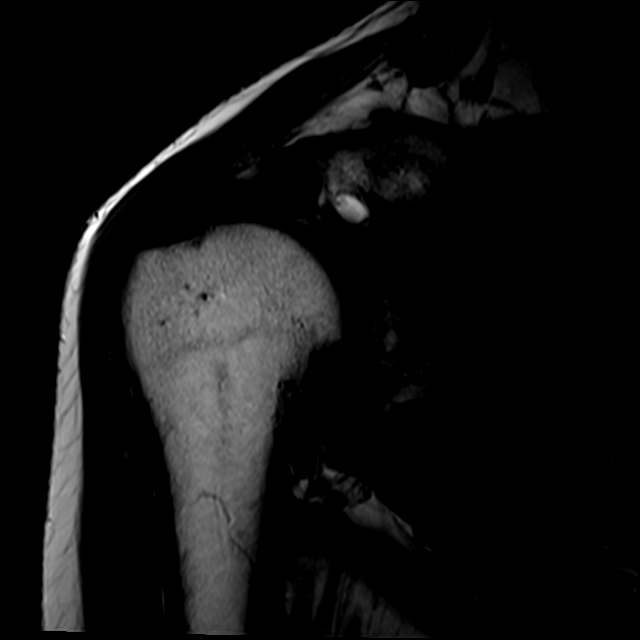
[im 14/19]
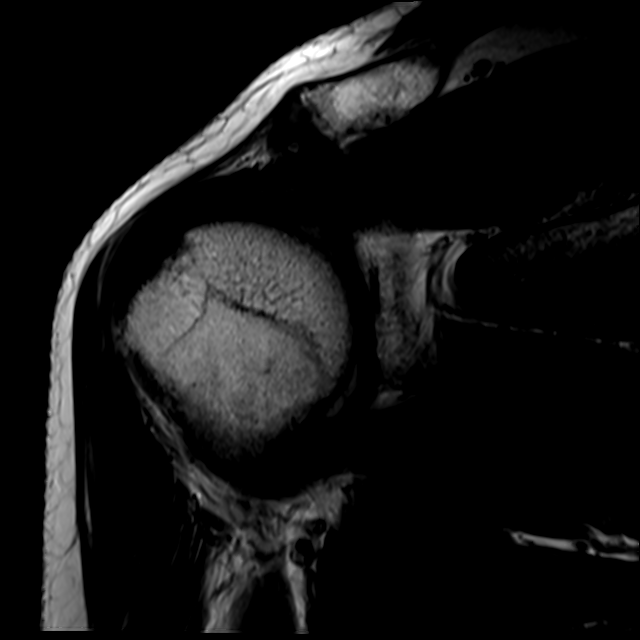
[im 19/19]
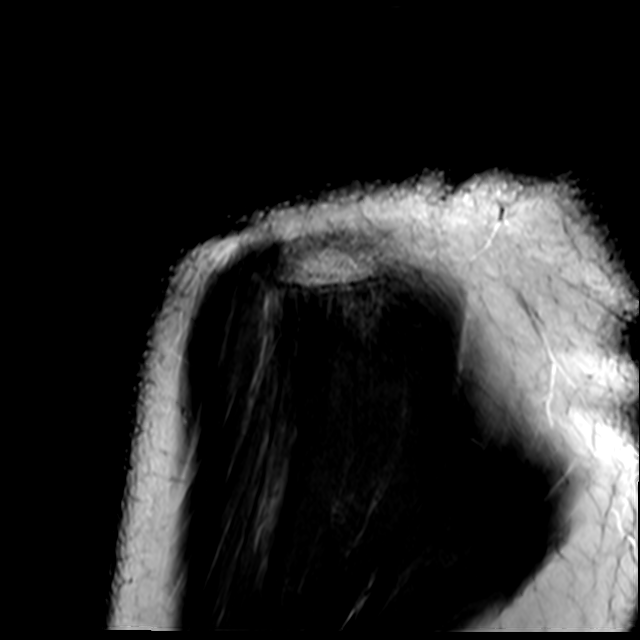

[Series 9: T2 fat-sat · coronal · right · 4.0mm · 0.44mm/px · 3 of 23 slices shown (3 of 3)]
[im 5/23]
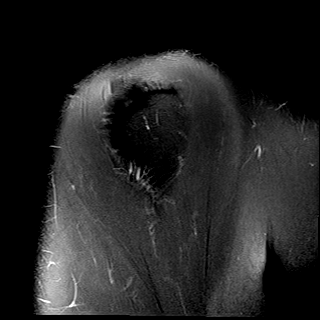
[im 14/23]
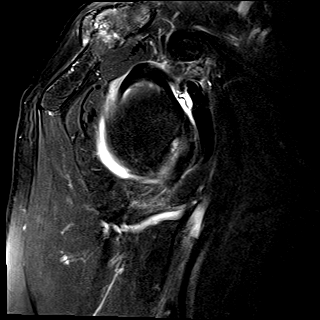
[im 23/23]
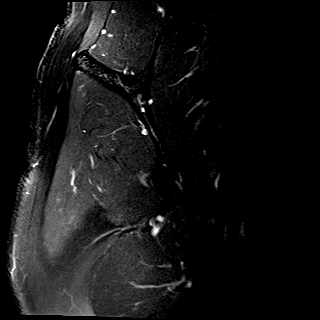

[21 of 40 positions shown; findings below may reference images not displayed]

FINDINGS: Rotator cuff: There is mild distal supraspinatus and infraspinatus
tendinosis. Teres minor tendon is intact. Subscapularis tendon is
intact.

Muscles: No significant muscle atrophy.

Biceps Long Head: Intraarticular and extraarticular portions of the
biceps tendon are intact.

Acromioclavicular Joint: Moderate arthropathy of the
acromioclavicular joint. Small amount subacromial/subdeltoid bursal
fluid.

Glenohumeral Joint: Trace joint effusion.  Mild chondrosis.

Labrum: Degenerative tearing of the superior labrum just anterior to
the biceps labral anchor.

Bones: No acute fracture or dislocation. No aggressive osseous
lesion.

Other: No fluid collection or hematoma.
IMPRESSION: Mild distal supraspinatus and infraspinatus tendinosis. No
high-grade or retracted cuff tear. No muscle atrophy.

Moderate acromioclavicular joint osteoarthritis. Mild glenohumeral
osteoarthritis.

Degenerative tearing of the superior labrum just anterior to the
biceps labral anchor.

## 2023-08-19 DIAGNOSIS — H524 Presbyopia: Secondary | ICD-10-CM | POA: Diagnosis not present

## 2023-08-19 DIAGNOSIS — H04123 Dry eye syndrome of bilateral lacrimal glands: Secondary | ICD-10-CM | POA: Diagnosis not present

## 2023-08-19 DIAGNOSIS — H25813 Combined forms of age-related cataract, bilateral: Secondary | ICD-10-CM | POA: Diagnosis not present

## 2023-08-19 DIAGNOSIS — H02889 Meibomian gland dysfunction of unspecified eye, unspecified eyelid: Secondary | ICD-10-CM | POA: Diagnosis not present

## 2023-08-19 DIAGNOSIS — H5203 Hypermetropia, bilateral: Secondary | ICD-10-CM | POA: Diagnosis not present

## 2023-10-16 ENCOUNTER — Ambulatory Visit: Admitting: Family Medicine

## 2023-10-16 ENCOUNTER — Encounter: Payer: Self-pay | Admitting: Family Medicine

## 2023-10-16 VITALS — BP 128/75 | HR 56 | Temp 97.3°F | Ht 74.0 in | Wt 191.2 lb

## 2023-10-16 DIAGNOSIS — R911 Solitary pulmonary nodule: Secondary | ICD-10-CM

## 2023-10-16 DIAGNOSIS — Z0001 Encounter for general adult medical examination with abnormal findings: Secondary | ICD-10-CM

## 2023-10-16 DIAGNOSIS — I700 Atherosclerosis of aorta: Secondary | ICD-10-CM | POA: Diagnosis not present

## 2023-10-16 DIAGNOSIS — N400 Enlarged prostate without lower urinary tract symptoms: Secondary | ICD-10-CM | POA: Diagnosis not present

## 2023-10-16 DIAGNOSIS — I10 Essential (primary) hypertension: Secondary | ICD-10-CM

## 2023-10-16 DIAGNOSIS — E782 Mixed hyperlipidemia: Secondary | ICD-10-CM | POA: Diagnosis not present

## 2023-10-16 DIAGNOSIS — Z0000 Encounter for general adult medical examination without abnormal findings: Secondary | ICD-10-CM

## 2023-10-16 LAB — LIPID PANEL

## 2023-10-16 MED ORDER — VALACYCLOVIR HCL 1 G PO TABS: 1000.0000 mg | ORAL_TABLET | ORAL | 3 refills | Status: AC | PRN

## 2023-10-16 NOTE — Progress Notes (Signed)
 BP 128/75   Pulse (!) 56   Temp (!) 97.3 F (36.3 C) (Temporal)   Ht 6' 2 (1.88 m)   Wt 191 lb 3.2 oz (86.7 kg)   SpO2 97%   BMI 24.55 kg/m    Subjective:   Patient ID: Manuel Kidd, male    DOB: July 22, 1950, 73 y.o.   MRN: [id number]  HPI: Manuel Kidd is a 73 y.o. male presenting on 10/16/2023 for Annual Exam (No concerns fasting )   Discussed the use of AI scribe software for clinical note transcription with the patient, who gave verbal consent to proceed.  History of Present Illness   Manuel Kidd is a 73 year old male with hypertension, hyperlipidemia, BPH, and aortic atherosclerosis who presents for a physical exam and recheck of chronic medical issues.  Hypertension and hyperlipidemia - Chronic hypertension and hyperlipidemia under ongoing management - No new symptoms or complications reported  Benign prostatic hyperplasia (bph) - Stable lower urinary tract symptoms - Currently taking alfuzosin (Uroxatral), which improves urinary flow - Recent PSA checked at last urology visit - Follow-up with urologist scheduled for December  Aortic atherosclerosis - Known diagnosis of aortic atherosclerosis - No new cardiovascular symptoms reported  Pulmonary nodule surveillance - Pulmonary nodule monitored with annual imaging - Uncertain of timing of last scan - We will repeat the scan in 6 months when he returns.  Ear sensation - Intermittent sensation of hair in the ear - No associated otalgia, hearing loss, or discharge          Relevant past medical, surgical, family and social history reviewed and updated as indicated. Interim medical history since our last visit reviewed. Allergies and medications reviewed and updated.  Review of Systems  Constitutional:  Negative for chills and fever.  HENT:  Negative for ear pain and tinnitus.   Eyes:  Negative for pain and discharge.  Respiratory:  Negative for cough, shortness of breath and wheezing.   Cardiovascular:   Negative for chest pain, palpitations and leg swelling.  Gastrointestinal:  Negative for abdominal pain, blood in stool, constipation and diarrhea.  Genitourinary:  Negative for dysuria and hematuria.  Musculoskeletal:  Negative for back pain, gait problem and myalgias.  Skin:  Negative for rash.  Neurological:  Negative for dizziness, weakness and headaches.  Psychiatric/Behavioral:  Negative for suicidal ideas.   All other systems reviewed and are negative.   Per HPI unless specifically indicated above   Allergies as of 10/16/2023       Reactions   Crestor  [rosuvastatin ] Other (See Comments)   Muscle pain   Lipitor [atorvastatin]    Muscle pain        Medication List        Accurate as of October 16, 2023  8:27 AM. If you have any questions, ask your nurse or doctor.          STOP taking these medications    prednisoLONE acetate 0.12 % ophthalmic suspension Commonly known as: PRED MILD Stopped by: Fonda LABOR Danetta Prom       TAKE these medications    alfuzosin 10 MG 24 hr tablet Commonly known as: UROXATRAL Take 10 mg by mouth at bedtime.   cycloSPORINE 0.05 % ophthalmic emulsion Commonly known as: RESTASIS Place 1 drop into both eyes 2 (two) times daily.   Fish Oil 1000 MG Caps Take 1 capsule by mouth daily. 5 days a week   multivitamin tablet Take 1 tablet by mouth daily.   valACYclovir  1000 MG  tablet Commonly known as: VALTREX  Take 1 tablet (1,000 mg total) by mouth as needed.   VITAMIN D  (ERGOCALCIFEROL ) PO Take 2,000 Units by mouth daily. 5 days a week         Objective:   BP 128/75   Pulse (!) 56   Temp (!) 97.3 F (36.3 C) (Temporal)   Ht 6' 2 (1.88 m)   Wt 191 lb 3.2 oz (86.7 kg)   SpO2 97%   BMI 24.55 kg/m   Wt Readings from Last 3 Encounters:  10/16/23 191 lb 3.2 oz (86.7 kg)  04/17/23 193 lb (87.5 kg)  04/06/23 195 lb (88.5 kg)    Physical Exam Vitals and nursing note reviewed.  Constitutional:      General: He is not  in acute distress.    Appearance: He is well-developed. He is not diaphoretic.  Eyes:     General: No scleral icterus.    Conjunctiva/sclera: Conjunctivae normal.  Neck:     Thyroid : No thyromegaly.  Cardiovascular:     Rate and Rhythm: Normal rate and regular rhythm.     Heart sounds: Normal heart sounds. No murmur heard. Pulmonary:     Effort: Pulmonary effort is normal. No respiratory distress.     Breath sounds: Normal breath sounds. No wheezing.  Musculoskeletal:        General: No swelling. Normal range of motion.     Cervical back: Neck supple.  Lymphadenopathy:     Cervical: No cervical adenopathy.  Skin:    General: Skin is warm and dry.     Findings: No rash.  Neurological:     Mental Status: He is alert and oriented to person, place, and time.     Coordination: Coordination normal.  Psychiatric:        Behavior: Behavior normal.    Physical Exam   NECK: Thyroid  without nodules. CHEST: Lungs clear to auscultation bilaterally. CARDIOVASCULAR: Heart regular rate and rhythm, no murmurs. Pulses intact in lower extremities. ABDOMEN: Abdomen non-tender, without masses. EXTREMITIES: No edema in extremities. NEUROLOGICAL: Reflexes normal.         Assessment & Plan:   Problem List Items Addressed This Visit       Cardiovascular and Mediastinum   Hypertension   Relevant Orders   CBC with Differential/Platelet   CMP14+EGFR   Lipid panel   Aortic atherosclerosis   Relevant Orders   CBC with Differential/Platelet   CMP14+EGFR   Lipid panel     Genitourinary   Benign prostatic hyperplasia     Other   Hyperlipidemia   Relevant Orders   CBC with Differential/Platelet   CMP14+EGFR   Lipid panel   Solitary pulmonary nodule   Other Visit Diagnoses       Physical exam    -  Primary   Relevant Orders   CBC with Differential/Platelet   CMP14+EGFR   Lipid panel           Adult Wellness Visit Routine physical examination with stable general health. -  Perform blood work to assess overall health status.  Essential hypertension Chronic condition with no changes in management.  Mixed hyperlipidemia Chronic condition requiring blood work to assess lipid levels and determine management adjustments. - Perform blood work to assess lipid levels.  Atherosclerosis of aorta Chronic condition with no changes in management.  Benign prostatic hyperplasia Well-managed with satisfactory urinary flow. Recent PSA checked by urologist. - Continue alfuzosin (Uroxatral). - Follow up with urologist in December.  Pulmonary nodule under surveillance  Under routine surveillance with inquiry about the timing of the last scan. - Review records to determine the date of the last scan.          Follow up plan: Return in about 6 months (around 04/15/2024) for Hypertension and hyperlipidemia recheck.  Counseling provided for all of the vaccine components Orders Placed This Encounter  Procedures   CBC with Differential/Platelet   CMP14+EGFR   Lipid panel    Fonda Levins, MD [facility] 10/16/2023, 8:27 AM

## 2023-10-17 LAB — CBC WITH DIFFERENTIAL/PLATELET
Basophils Absolute: 0.1 x10E3/uL (ref 0.0–0.2)
Basos: 1 %
EOS (ABSOLUTE): 0.4 x10E3/uL (ref 0.0–0.4)
Eos: 9 %
Hematocrit: 46.1 % (ref 37.5–51.0)
Hemoglobin: 15 g/dL (ref 13.0–17.7)
Immature Grans (Abs): 0 x10E3/uL (ref 0.0–0.1)
Immature Granulocytes: 0 %
Lymphocytes Absolute: 0.9 x10E3/uL (ref 0.7–3.1)
Lymphs: 21 %
MCH: 28.8 pg (ref 26.6–33.0)
MCHC: 32.5 g/dL (ref 31.5–35.7)
MCV: 89 fL (ref 79–97)
Monocytes Absolute: 0.3 x10E3/uL (ref 0.1–0.9)
Monocytes: 8 %
Neutrophils Absolute: 2.6 x10E3/uL (ref 1.4–7.0)
Neutrophils: 61 %
Platelets: 152 x10E3/uL (ref 150–450)
RBC: 5.2 x10E6/uL (ref 4.14–5.80)
RDW: 13.1 % (ref 11.6–15.4)
WBC: 4.3 x10E3/uL (ref 3.4–10.8)

## 2023-10-17 LAB — CMP14+EGFR
ALT: 16 IU/L (ref 0–44)
AST: 25 IU/L (ref 0–40)
Albumin: 4.5 g/dL (ref 3.8–4.8)
Alkaline Phosphatase: 69 IU/L (ref 47–123)
BUN/Creatinine Ratio: 13 (ref 10–24)
BUN: 14 mg/dL (ref 8–27)
Bilirubin Total: 0.7 mg/dL (ref 0.0–1.2)
CO2: 27 mmol/L (ref 20–29)
Calcium: 9.7 mg/dL (ref 8.6–10.2)
Chloride: 104 mmol/L (ref 96–106)
Creatinine, Ser: 1.09 mg/dL (ref 0.76–1.27)
Globulin, Total: 2.3 g/dL (ref 1.5–4.5)
Glucose: 100 mg/dL — ABNORMAL HIGH (ref 70–99)
Potassium: 5.1 mmol/L (ref 3.5–5.2)
Sodium: 141 mmol/L (ref 134–144)
Total Protein: 6.8 g/dL (ref 6.0–8.5)
eGFR: 72 mL/min/1.73 (ref >59)

## 2023-10-17 LAB — LIPID PANEL
Chol/HDL Ratio: 3.9 ratio (ref 0.0–5.0)
Cholesterol, Total: 187 mg/dL (ref 100–199)
HDL: 48 mg/dL (ref >39)
LDL Chol Calc (NIH): 129 mg/dL — ABNORMAL HIGH (ref 0–99)
Triglycerides: 53 mg/dL (ref 0–149)
VLDL Cholesterol Cal: 10 mg/dL (ref 5–40)

## 2023-10-22 ENCOUNTER — Ambulatory Visit: Payer: Self-pay | Admitting: Family Medicine

## 2023-11-12 DIAGNOSIS — H04121 Dry eye syndrome of right lacrimal gland: Secondary | ICD-10-CM | POA: Diagnosis not present

## 2023-11-12 DIAGNOSIS — L718 Other rosacea: Secondary | ICD-10-CM | POA: Diagnosis not present

## 2023-11-12 DIAGNOSIS — H04122 Dry eye syndrome of left lacrimal gland: Secondary | ICD-10-CM | POA: Diagnosis not present

## 2023-11-12 DIAGNOSIS — H02889 Meibomian gland dysfunction of unspecified eye, unspecified eyelid: Secondary | ICD-10-CM | POA: Diagnosis not present

## 2023-11-12 DIAGNOSIS — H04123 Dry eye syndrome of bilateral lacrimal glands: Secondary | ICD-10-CM | POA: Diagnosis not present

## 2023-11-16 ENCOUNTER — Encounter: Payer: Self-pay | Admitting: Radiology

## 2023-11-23 DIAGNOSIS — L814 Other melanin hyperpigmentation: Secondary | ICD-10-CM | POA: Diagnosis not present

## 2023-11-23 DIAGNOSIS — L570 Actinic keratosis: Secondary | ICD-10-CM | POA: Diagnosis not present

## 2023-11-23 DIAGNOSIS — L300 Nummular dermatitis: Secondary | ICD-10-CM | POA: Diagnosis not present

## 2023-11-23 DIAGNOSIS — L821 Other seborrheic keratosis: Secondary | ICD-10-CM | POA: Diagnosis not present

## 2023-11-23 DIAGNOSIS — L578 Other skin changes due to chronic exposure to nonionizing radiation: Secondary | ICD-10-CM | POA: Diagnosis not present

## 2023-11-23 DIAGNOSIS — Z85828 Personal history of other malignant neoplasm of skin: Secondary | ICD-10-CM | POA: Diagnosis not present

## 2023-12-14 DIAGNOSIS — Z1330 Encounter for screening examination for mental health and behavioral disorders, unspecified: Secondary | ICD-10-CM | POA: Diagnosis not present

## 2023-12-14 DIAGNOSIS — R399 Unspecified symptoms and signs involving the genitourinary system: Secondary | ICD-10-CM | POA: Diagnosis not present

## 2023-12-14 DIAGNOSIS — N401 Enlarged prostate with lower urinary tract symptoms: Secondary | ICD-10-CM | POA: Diagnosis not present

## 2024-04-18 ENCOUNTER — Ambulatory Visit: Payer: Self-pay | Admitting: Family Medicine
